# Patient Record
Sex: Male | Born: 1955 | Race: White | Hispanic: No | Marital: Married | Smoking: Former smoker
Health system: Southern US, Community
[De-identification: ages and names within clinical notes are randomized; demographics above are authoritative.]

## PROBLEM LIST (undated history)

## (undated) DIAGNOSIS — E785 Hyperlipidemia, unspecified: Secondary | ICD-10-CM

## (undated) DIAGNOSIS — N486 Induration penis plastica: Secondary | ICD-10-CM

## (undated) DIAGNOSIS — K219 Gastro-esophageal reflux disease without esophagitis: Secondary | ICD-10-CM

## (undated) DIAGNOSIS — I10 Essential (primary) hypertension: Secondary | ICD-10-CM

## (undated) DIAGNOSIS — B019 Varicella without complication: Secondary | ICD-10-CM

## (undated) DIAGNOSIS — C801 Malignant (primary) neoplasm, unspecified: Secondary | ICD-10-CM

## (undated) DIAGNOSIS — N529 Male erectile dysfunction, unspecified: Secondary | ICD-10-CM

## (undated) DIAGNOSIS — K635 Polyp of colon: Secondary | ICD-10-CM

## (undated) DIAGNOSIS — M1990 Unspecified osteoarthritis, unspecified site: Secondary | ICD-10-CM

## (undated) DIAGNOSIS — E669 Obesity, unspecified: Secondary | ICD-10-CM

## (undated) DIAGNOSIS — E119 Type 2 diabetes mellitus without complications: Secondary | ICD-10-CM

## (undated) DIAGNOSIS — F419 Anxiety disorder, unspecified: Secondary | ICD-10-CM

## (undated) HISTORY — DX: Polyp of colon: K63.5

## (undated) HISTORY — PX: COLONOSCOPY: SHX174

## (undated) HISTORY — DX: Male erectile dysfunction, unspecified: N52.9

## (undated) HISTORY — PX: SKIN TAG REMOVAL: SHX780

## (undated) HISTORY — DX: Malignant (primary) neoplasm, unspecified: C80.1

## (undated) HISTORY — DX: Varicella without complication: B01.9

## (undated) HISTORY — PX: VASECTOMY: SHX75

## (undated) HISTORY — DX: Induration penis plastica: N48.6

## (undated) HISTORY — DX: Hyperlipidemia, unspecified: E78.5

## (undated) HISTORY — DX: Essential (primary) hypertension: I10

## (undated) HISTORY — DX: Gastro-esophageal reflux disease without esophagitis: K21.9

## (undated) HISTORY — DX: Type 2 diabetes mellitus without complications: E11.9

## (undated) HISTORY — PX: POLYPECTOMY: SHX149

## (undated) HISTORY — DX: Anxiety disorder, unspecified: F41.9

## (undated) HISTORY — DX: Obesity, unspecified: E66.9

## (undated) HISTORY — DX: Unspecified osteoarthritis, unspecified site: M19.90

---
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
## 2007-07-06 ENCOUNTER — Encounter: Payer: Self-pay | Admitting: Family Medicine

## 2007-07-06 LAB — HM COLONOSCOPY: HM Colonoscopy: ABNORMAL

## 2007-11-12 ENCOUNTER — Encounter: Payer: Self-pay | Admitting: Family Medicine

## 2007-11-12 LAB — CONVERTED CEMR LAB
HDL: 62 mg/dL
LDL Cholesterol: 105 mg/dL

## 2008-05-12 ENCOUNTER — Encounter: Payer: Self-pay | Admitting: Family Medicine

## 2008-05-12 LAB — CONVERTED CEMR LAB: LDL Cholesterol: 94 mg/dL

## 2008-07-25 ENCOUNTER — Ambulatory Visit: Payer: Self-pay | Admitting: Family Medicine

## 2008-07-25 DIAGNOSIS — F528 Other sexual dysfunction not due to a substance or known physiological condition: Secondary | ICD-10-CM | POA: Insufficient documentation

## 2008-07-25 DIAGNOSIS — F1010 Alcohol abuse, uncomplicated: Secondary | ICD-10-CM | POA: Insufficient documentation

## 2008-07-25 DIAGNOSIS — Z87448 Personal history of other diseases of urinary system: Secondary | ICD-10-CM | POA: Insufficient documentation

## 2008-07-28 ENCOUNTER — Encounter: Payer: Self-pay | Admitting: Family Medicine

## 2008-07-28 DIAGNOSIS — E785 Hyperlipidemia, unspecified: Secondary | ICD-10-CM

## 2008-07-28 DIAGNOSIS — E1169 Type 2 diabetes mellitus with other specified complication: Secondary | ICD-10-CM | POA: Insufficient documentation

## 2008-07-28 DIAGNOSIS — M1990 Unspecified osteoarthritis, unspecified site: Secondary | ICD-10-CM | POA: Insufficient documentation

## 2008-11-03 ENCOUNTER — Encounter: Payer: Self-pay | Admitting: Family Medicine

## 2008-11-03 ENCOUNTER — Ambulatory Visit: Payer: Self-pay | Admitting: Family Medicine

## 2008-11-03 DIAGNOSIS — K219 Gastro-esophageal reflux disease without esophagitis: Secondary | ICD-10-CM | POA: Insufficient documentation

## 2008-11-05 LAB — CONVERTED CEMR LAB
ALT: 27 units/L (ref 0–53)
AST: 26 units/L (ref 0–37)
Alkaline Phosphatase: 58 units/L (ref 39–117)
Basophils Absolute: 0.1 10*3/uL (ref 0.0–0.1)
Bilirubin, Direct: 0.1 mg/dL (ref 0.0–0.3)
CO2: 31 meq/L (ref 19–32)
Chloride: 106 meq/L (ref 96–112)
Cholesterol: 182 mg/dL (ref 0–200)
Creatinine, Ser: 1 mg/dL (ref 0.4–1.5)
Eosinophils Absolute: 0.1 10*3/uL (ref 0.0–0.7)
GFR calc non Af Amer: 83 mL/min
HDL: 60 mg/dL (ref >39.0)
LDL Cholesterol: 102 mg/dL — ABNORMAL HIGH (ref 0–99)
Lymphocytes Relative: 30.2 % (ref 12.0–46.0)
MCHC: 34.9 g/dL (ref 30.0–36.0)
MCV: 93.6 fL (ref 78.0–100.0)
Neutrophils Relative %: 57.7 % (ref 43.0–77.0)
PSA: 1.1 ng/mL (ref 0.10–4.00)
Platelets: 238 10*3/uL (ref 150–400)
Potassium: 4.3 meq/L (ref 3.5–5.1)
RBC: 4.66 M/uL (ref 4.22–5.81)
Sodium: 141 meq/L (ref 135–145)
Total Bilirubin: 1.1 mg/dL (ref 0.3–1.2)
VLDL: 20 mg/dL (ref 0–40)

## 2009-09-07 ENCOUNTER — Ambulatory Visit: Payer: Self-pay | Admitting: Family Medicine

## 2009-11-16 ENCOUNTER — Ambulatory Visit: Payer: Self-pay | Admitting: Family Medicine

## 2009-11-16 DIAGNOSIS — R5383 Other fatigue: Secondary | ICD-10-CM | POA: Insufficient documentation

## 2009-11-16 DIAGNOSIS — R5381 Other malaise: Secondary | ICD-10-CM

## 2009-11-16 LAB — CONVERTED CEMR LAB
Albumin: 4.3 g/dL (ref 3.5–5.2)
Basophils Relative: 0.9 % (ref 0.0–3.0)
Bilirubin, Direct: 0.1 mg/dL (ref 0.0–0.3)
CO2: 32 meq/L (ref 19–32)
Calcium: 9 mg/dL (ref 8.4–10.5)
Creatinine, Ser: 1 mg/dL (ref 0.4–1.5)
GFR calc non Af Amer: 82.33 mL/min (ref >60)
HCT: 42.6 % (ref 39.0–52.0)
HDL: 59.1 mg/dL (ref >39.00)
Hemoglobin: 14 g/dL (ref 13.0–17.0)
Lymphocytes Relative: 31.3 % (ref 12.0–46.0)
MCHC: 32.9 g/dL (ref 30.0–36.0)
Monocytes Relative: 9.6 % (ref 3.0–12.0)
Neutro Abs: 3.9 10*3/uL (ref 1.4–7.7)
RBC: 4.43 M/uL (ref 4.22–5.81)
Total CHOL/HDL Ratio: 3
Total Protein: 7.2 g/dL (ref 6.0–8.3)
Triglycerides: 81 mg/dL (ref 0.0–149.0)
VLDL: 16.2 mg/dL (ref 0.0–40.0)

## 2009-11-23 ENCOUNTER — Ambulatory Visit: Payer: Self-pay | Admitting: Family Medicine

## 2009-11-24 ENCOUNTER — Encounter: Payer: Self-pay | Admitting: Family Medicine

## 2010-03-12 ENCOUNTER — Encounter: Payer: Self-pay | Admitting: Family Medicine

## 2010-03-15 ENCOUNTER — Telehealth: Payer: Self-pay | Admitting: Family Medicine

## 2010-06-09 ENCOUNTER — Encounter: Payer: Self-pay | Admitting: Family Medicine

## 2010-06-09 ENCOUNTER — Observation Stay: Payer: Self-pay | Admitting: Internal Medicine

## 2010-06-09 ENCOUNTER — Ambulatory Visit: Payer: Self-pay | Admitting: Cardiology

## 2010-06-17 ENCOUNTER — Telehealth: Payer: Self-pay | Admitting: Family Medicine

## 2010-06-17 ENCOUNTER — Ambulatory Visit: Payer: Self-pay | Admitting: Family Medicine

## 2010-06-17 DIAGNOSIS — F411 Generalized anxiety disorder: Secondary | ICD-10-CM | POA: Insufficient documentation

## 2010-06-17 DIAGNOSIS — R079 Chest pain, unspecified: Secondary | ICD-10-CM | POA: Insufficient documentation

## 2010-06-18 ENCOUNTER — Encounter: Payer: Self-pay | Admitting: Family Medicine

## 2010-06-25 ENCOUNTER — Telehealth: Payer: Self-pay | Admitting: Family Medicine

## 2010-11-09 NOTE — Letter (Signed)
Summary: [facility]   Imported By: Lanelle Bal 06/21/2010 11:08:29  _____________________________________________________________________  External Attachment:    Type:   Image     Comment:   External Document

## 2010-11-09 NOTE — Medication Information (Signed)
Summary: Denial for Lipitor/[employer]  Denial for Lipitor/[employer]   Imported By: Lanelle Bal 03/22/2010 09:11:29  _____________________________________________________________________  External Attachment:    Type:   Image     Comment:   External Document

## 2010-11-09 NOTE — Consult Note (Signed)
Summary: [facility]  [facility]   Imported By: Lanelle Bal 12/04/2009 09:55:40  _____________________________________________________________________  External Attachment:    Type:   Image     Comment:   External Document

## 2010-11-09 NOTE — Progress Notes (Signed)
Summary: prior auth denial for lipitor  Phone Note Other Incoming   Caller: Marine scientist of Call: Letter regarding prior auth denial for lipitor is on your desk.      Lowella Petties CMA  March 15, 2010 8:34 AM   Follow-up for Phone Call        Let patient know that his lipitor denied by insurance  Simvistatin 40 mg, 1 by mouth daily is a generic that is very similar. Call in to the pharmacy of their choice Call in #30, 11 refills. OR if they prefer a 90 day supply, #90 with 3 refills is OK, too Prescription instructions above    Follow-up by: Hannah Beat MD,  March 15, 2010 9:37 AM    New/Updated Medications: SIMVASTATIN 40 MG TABS (SIMVASTATIN) take one tablet daily Prescriptions: SIMVASTATIN 40 MG TABS (SIMVASTATIN) take one tablet daily  #90 x 3   Entered by:   Benny Lennert CMA (AAMA)   Authorized by:   Hannah Beat MD   Signed by:   Benny Lennert CMA (AAMA) on 03/15/2010   Method used:   Electronically to        [facility]* (retail)       [address]       [place], [place]  [address]       Ph: [phone]       Fax: [phone]   RxID:   [id number]

## 2010-11-09 NOTE — Progress Notes (Signed)
Summary: prior Berkley Harvey is needed for dexilant  Phone Note From Pharmacy   Caller: [facility]*/ [employer] Summary of Call: Prior Berkley Harvey is needed for dexilant, form is on your desk. Initial call taken by: Lowella Petties CMA,  June 17, 2010 4:13 PM

## 2010-11-09 NOTE — Progress Notes (Signed)
Summary: prior auth denied for kapidex  Phone Note Other Incoming   Caller: [employer] Summary of Call: Insurance will not cover kapidex, letter is on your desk. Initial call taken by: Lowella Petties CMA,  June 25, 2010 4:05 PM  Follow-up for Phone Call        Call  insurance denied coverage for kapidex.  There are others - reasonable to try them and should be covered by insurance.  protonix 40 mg, 1 by mouth daily 30 minutes before breakfast. #30, 11 refills  Call in #30, 11 refills. OR if they prefer a 90 day supply, #90 with 3 refills is OK, too Prescription instructions above  Follow-up by: Hannah Beat MD,  June 28, 2010 3:22 PM    New/Updated Medications: PROTONIX 40 MG TBEC (PANTOPRAZOLE SODIUM) take on tablet daily Prescriptions: PROTONIX 40 MG TBEC (PANTOPRAZOLE SODIUM) take on tablet daily  #30 x 11   Entered by:   Benny Lennert CMA (AAMA)   Authorized by:   Hannah Beat MD   Signed by:   Benny Lennert CMA (AAMA) on 06/28/2010   Method used:   Electronically to        [facility]* (retail)       [address]       [place], [place]  [address]       Ph: [phone]       Fax: [phone]   RxID:   [id number]   Prior Medications: METOPROLOL TARTRATE 25 MG TABS (METOPROLOL TARTRATE) take 1 by mouth two times a day ELIDEL 1 % CREA (PIMECROLIMUS) Apply to effected area two times a day SIMVASTATIN 40 MG TABS (SIMVASTATIN) take one tablet daily ASPIRIN 81 MG TABS (ASPIRIN) take 1 by mouth once daily VITAMIN C 500 MG TABS (ASCORBIC ACID) take 1 by mouth once daily VITAMIN B-12 1000 MCG TABS (CYANOCOBALAMIN) take 1 by mouth once daily MULTIVITAMINS  CAPS (MULTIPLE VITAMIN) take 1 by mouth once daily GLUCOSAMINE 500 MG CAPS (GLUCOSAMINE SULFATE) take 1 by mouth once daily DEXILANT 60 MG CPDR (DEXLANSOPRAZOLE) 1 by mouth daily LORAZEPAM 0.5 MG TABS (LORAZEPAM) 1 by mouth two times a day PROTONIX 40 MG TBEC (PANTOPRAZOLE SODIUM) take on tablet  daily Current Allergies: No known allergies

## 2010-11-09 NOTE — Assessment & Plan Note (Signed)
Summary: F/U [facility]  D/C 06/09/10/[facility]   Vital Signs:  Patient profile:   55 year old Garza Weight:      250 pounds Temp:     99.1 degrees F oral Pulse rate:   68 / minute Pulse rhythm:   regular BP sitting:   120 / 70  (left arm) Cuff size:   large  Vitals Entered By: Mervin Hack CMA Duncan Dull) (June 17, 2010 9:06 AM) CC: hospital follow-up   History of Present Illness: Tony Garza:  d/c [facility], 06/09/2010.  Was at home, had some severe pressure in his chest.  Feels some pressure in the left anterior chest wall. Across chest and up his neck. Anxious uneasy, could not catch his breath. Went to the hospital. Did not really feel better since then.   Having a lot of stress with work. Financial issues. House in [facility]. Not sleeeping well.  Feeling older.   all hospital documentation reviewed. The patient was admitted for chest pain, had a negative chest x-ray, normal cardiac enzymes, and ultimately had negative exercise nuclear test.  He ran for about 10 minutes or walk for about 10 minutes on the Bruce protocol, and then had a normal  nuclear scan.    Allergies: No Known Drug Allergies  Past History:  Past Medical History: Peyroni's diease Hyperlipidemia Duypetren's contractures Osteoarthritis Erectile Dysfunction GERD Obesity Anxiety  Past Surgical History: Vasectomy [facility], 05/2010, CP, negative nuclear stress, 60% EF  Colonoscopy 2008, polyps, repeat 2013  Review of Systems      See HPI General:  Complains of fatigue and sleep disorder; denies chills and fever. CV:  Complains of chest pain or discomfort and fainting; denies palpitations, shortness of breath with exertion, swelling of feet, and swelling of hands. Psych:  Complains of anxiety and irritability.  Physical Exam  General:  Well-developed,well-nourished,in no acute distress; alert,appropriate and cooperative throughout examination Head:  normocephalic and atraumatic.   Ears:  no external  deformities.   Nose:  no external deformity.   Lungs:  Normal respiratory effort, chest expands symmetrically. Lungs are clear to auscultation, no crackles or wheezes. Heart:  Normal rate and regular rhythm. S1 and S2 normal without gallop, murmur, click, rub or other extra sounds. Extremities:  No clubbing, cyanosis, edema, or deformity noted with normal full range of motion of all joints.   Neurologic:  alert & oriented X3 and gait normal.   Cervical Nodes:  No lymphadenopathy noted Psych:  Cognition and judgment appear intact. Alert and cooperative with normal attention span and concentration. No apparent delusions, illusions, hallucinations   Impression & Recommendations:  Problem # 1:  CHEST PAIN (ICD-786.50) normal nuclear stress test is reassuring. I think this is mostly decompensated GERD, but also there is a very significant anxiety component present.   Both diagnostically and therapeutically, I have given him some Ativan to try when he is having some chest pain.  Also, will change to [facility] for extended GERD protection.  Problem # 2:  GERD (ICD-530.81) Assessment: Deteriorated  The following medications were removed from the medication list:    Omeprazole 20 Mg Cpdr (Omeprazole) .Marland Kitchen... 1 by mouth daily His updated medication list for this problem includes:    Dexilant 60 Mg Cpdr (Dexlansoprazole) .Marland Kitchen... 1 by mouth daily  Problem # 3:  OTHER ANXIETY STATES (ICD-300.09) Assessment: New  His updated medication list for this problem includes:    Lorazepam 0.5 Mg Tabs (Lorazepam) .Marland Kitchen... 1 by mouth two times a day  Complete Medication List: 1)  Metoprolol Tartrate 25 Mg Tabs (Metoprolol tartrate) .... Take 1 by mouth two times a day 2)  Elidel 1 % Crea (Pimecrolimus) .... Apply to effected area two times a day 3)  Simvastatin 40 Mg Tabs (Simvastatin) .... Take one tablet daily 4)  Aspirin 81 Mg Tabs (Aspirin) .... Take 1 by mouth once daily 5)  Vitamin C 500 Mg Tabs  (Ascorbic acid) .... Take 1 by mouth once daily 6)  Vitamin B-12 1000 Mcg Tabs (Cyanocobalamin) .... Take 1 by mouth once daily 7)  Multivitamins Caps (Multiple vitamin) .... Take 1 by mouth once daily 8)  Glucosamine 500 Mg Caps (Glucosamine sulfate) .... Take 1 by mouth once daily 9)  Dexilant 60 Mg Cpdr (Dexlansoprazole) .Marland Kitchen.. 1 by mouth daily 10)  Lorazepam 0.5 Mg Tabs (Lorazepam) .Marland Kitchen.. 1 by mouth two times a day  Patient Instructions: 1)  Try Dexilant samples, 30 minutes before breakfast 2)  if getting some anxiety, ok to try 1 dose of lorazepam Prescriptions: LORAZEPAM 0.5 MG TABS (LORAZEPAM) 1 by mouth two times a day  #30 x 0   Entered and Authorized by:   Hannah Beat MD   Signed by:   Hannah Beat MD on 06/17/2010   Method used:   Print then Give to Patient   RxID:   [id number] DEXILANT 60 MG CPDR (DEXLANSOPRAZOLE) 1 by mouth daily  #30 x 11   Entered and Authorized by:   Hannah Beat MD   Signed by:   Hannah Beat MD on 06/17/2010   Method used:   Electronically to        [facility]* (retail)       [address]       [place], [place]  [address]       Ph: [phone]       Fax: [phone]   RxID:   [id number]   Current Allergies (reviewed today): No known allergies

## 2010-11-09 NOTE — Assessment & Plan Note (Signed)
Summary: CPX/[facility]   Vital Signs:  Patient profile:   55 year old male Height:      73 inches Weight:      263.6 pounds BMI:     34.90 Temp:     98.7 degrees F oral Pulse rate:   68 / minute Pulse rhythm:   regular BP sitting:   120 / 86  (left arm) Cuff size:   large  Vitals Entered By: Benny Lennert CMA Duncan Dull) (November 23, 2009 2:51 PM)   History of Present Illness: Chief complaint cpx  Preventive Screening-Counseling & Management  Alcohol-Tobacco     Alcohol drinks/day: 4     Alcohol type: beer     Alcohol Counseling: to decrease amount and/or frequency of alcohol intake     Feels need to cut down: yes     Smoking Status: quit     Pack years: 20     Tobacco Counseling: to remain off tobacco products  Caffeine-Diet-Exercise     Diet Comments: poor     Diet Counseling: to improve diet; diet is suboptimal     Does Patient Exercise: no     Type of exercise: walks a few times a week     Exercise Counseling: to improve exercise regimen  Hep-HIV-STD-Contraception     STD Risk: no risk noted     Testicular SE Education/Counseling to perform regular STE      Sexual History:  currently monogamous.        Drug Use:  never.    Clinical Review Panels:  Prevention   Last Colonoscopy:  abnormal (07/06/2007)   Last PSA:  1.37 (11/16/2009)  Immunizations   Last Flu Vaccine:  Fluvax 3+ (09/07/2009)   Last H1N1 Vaccine 1:  H1N1 vaccine G code (11/03/2008)  Lipid Management   Cholesterol:  175 (11/16/2009)   LDL (bad choesterol):  100 (11/16/2009)   HDL (good cholesterol):  59.10 (11/16/2009)  CBC   WBC:  7.3 (11/16/2009)   RBC:  4.43 (11/16/2009)   Hgb:  14.0 (11/16/2009)   Hct:  42.6 (11/16/2009)   Platelets:  255.0 (11/16/2009)   MCV  96.1 (11/16/2009)   MCHC  32.9 (11/16/2009)   RDW  12.4 (11/16/2009)   PMN:  54.5 (11/16/2009)   Lymphs:  31.3 (11/16/2009)   Monos:  9.6 (11/16/2009)   Eosinophils:  3.7 (11/16/2009)   Basophil:  0.9  (11/16/2009)  Complete Metabolic Panel   Glucose:  119 (11/16/2009)   Sodium:  142 (11/16/2009)   Potassium:  4.7 (11/16/2009)   Chloride:  104 (11/16/2009)   CO2:  32 (11/16/2009)   BUN:  8 (11/16/2009)   Creatinine:  1.0 (11/16/2009)   Albumin:  4.3 (11/16/2009)   Total Protein:  7.2 (11/16/2009)   Calcium:  9.0 (11/16/2009)   Total Bili:  0.5 (11/16/2009)   Alk Phos:  65 (11/16/2009)   SGPT (ALT):  21 (11/16/2009)   SGOT (AST):  21 (11/16/2009)   Current Problems (verified): 1)  Other Malaise and Fatigue  (ICD-780.79) 2)  Screening For Diabetes Mellitus  (ICD-V77.1) 3)  Obesity  (ICD-278.00) 4)  Gerd  (ICD-530.81) 5)  Health Maintenance Exam  (ICD-V70.0) 6)  Special Screening Malignant Neoplasm of Prostate  (ICD-V76.44) 7)  Encounter For Long-term Use of Other Medications  (ICD-V58.69) 8)  Alcohol Use  (ICD-305.00) 9)  Impotence  (ICD-302.72) 10)  Peyronie's Disease, Hx of  (ICD-V13.09) 11)  Osteoarthritis  (ICD-715.90) 12)  Hyperlipidemia  (ICD-272.4)  Allergies (verified): No Known Drug Allergies  Past History:  Past medical, surgical, family and social histories (including risk factors) reviewed, and no changes noted (except as noted below).  Past Medical History: Reviewed history from 11/03/2008 and no changes required. Peyroni's diease Hyperlipidemia Duypetren's contractures Osteoarthritis Erectile Dysfunction GERD Obesity  Past Surgical History: Reviewed history from 07/25/2008 and no changes required. Vasectomy  Colonoscopy 2008, polyps, repeat 2013  Family History: Reviewed history from 09/07/2009 and no changes required. Father: Sudden death < 44 Mother: DM Siblings:  Family History of Sudden Death Family History Diabetes 1st degree relative  Father, d/c from lymphoma, found in ENT  Social History: Reviewed history from 07/25/2008 and no changes required. Occupation: Financial planner Tobacco, 20 year pack history, currently quit Alcohol  use-yes, case beer/week Regular exercise-no STD Risk:  no risk noted Sexual History:  currently monogamous Drug Use:  never  Review of Systems  General: Denies fever, chills, sweats, anorexia, fatigue, weakness, malaise Eyes: Denies blurring, vision loss ENT: Denies earache, nasal congestion, nosebleeds, sore throat, and hoarseness.  Cardiovascular: Denies chest pains, palpitations, syncope, dyspnea on exertion,  Respiratory: Denies cough, dyspnea at rest, excessive sputum,wheeezing GI: Denies nausea, vomiting, diarrhea, constipation, change in bowel habits, abdominal pain, melena, hematochezia, HAD A SMALL BOIL PERIRECTAL GU: CONT WITH ED ISSUES, BUT PRIMARY PEYRONIES Musculoskeletal: SOME PF OCC Derm: SKIN LESION ADDRESSED ON PRIOR OFFICE VISIT STILL PRESENT AND HAS DERMATOLOGY APPOINTMENT THIS WEEK Neuro: Denies  paresthesias, frequent falls, frequent headaches, and difficulty walking.  Psych: Denies depression, anxiety Endocrine: Denies cold intolerance, heat intolerance, polydipsia, polyphagia, polyuria, and unusual weight change.  Heme: Denies enlarged lymph nodes Allergy: No hayfever   Otherwise, the pertinent positives and negatives are listed above and in the HPI, otherwise a full review of systems has been reviewed and is negative unless noted positive.   Physical Exam  General:  Well-developed,well-nourished,in no acute distress; alert,appropriate and cooperative throughout examination Head:  Normocephalic and atraumatic without obvious abnormalities.  Eyes:  vision grossly intact, pupils equal, pupils round, pupils reactive to light, and pupils react to accomodation.   Ears:  External ear exam shows no significant lesions or deformities.  Otoscopic examination reveals clear canals, tympanic membranes are intact bilaterally without bulging, retraction, inflammation or discharge. Hearing is grossly normal bilaterally. Nose:  External nasal examination shows no deformity or  inflammation. Nasal mucosa are pink and moist without lesions or exudates. Mouth:  Oral mucosa and oropharynx without lesions or exudates.  Teeth in good repair. Neck:  No deformities, masses, or tenderness noted. Chest Wall:  No deformities, masses, tenderness or gynecomastia noted. Lungs:  Normal respiratory effort, chest expands symmetrically. Lungs are clear to auscultation, no crackles or wheezes. Heart:  Normal rate and regular rhythm. S1 and S2 normal without gallop, murmur, click, rub or other extra sounds. Abdomen:  Bowel sounds positive,abdomen soft and non-tender without masses, organomegaly or hernias noted. Rectal:  No external abnormalities noted. Normal sphincter tone. No rectal masses or tenderness. Genitalia:  Testes bilaterally descended without nodularity, tenderness or masses. No scrotal masses or lesions. No penis lesions or urethral discharge. Prostate:  mod enlargement, no nodules, no asymmetry, and no induration.   Msk:  normal ROM, no joint instability, and no crepitation.   Extremities:  No clubbing, cyanosis, edema, or deformity noted with normal full range of motion of all joints.   Neurologic:  alert & oriented X3, sensation intact to light touch, and gait normal.   Skin:  L leg, 2 x 3 cm lesion, some crust and elevated  Cervical Nodes:  No lymphadenopathy noted Psych:  Cognition and judgment appear intact. Alert and cooperative with normal attention span and concentration. No apparent delusions, illusions, hallucinations   Impression & Recommendations:  Problem # 1:  HEALTH MAINTENANCE EXAM (ICD-V70.0) The patient's preventative maintenance and recommended screening tests for an annual wellness exam were reviewed in full today. Brought up to date unless services declined.  Counselled on the importance of diet, exercise, and its role in overall health and mortality. The patient's FH and SH was reviewed, including their home life, tobacco status, and drug and  alcohol status.   DECREASE ETOH KEEP DERM APPT THIS WEEK  Complete Medication List: 1)  Lipitor 20 Mg Tabs (Atorvastatin calcium) .... Take one tablet daily 2)  Vitamin E  .... Take one tablet daily 3)  Glucosamine  .... Take one tablet daily 4)  Elidel 1 % Crea (Pimecrolimus) .... Apply to effected area two times a day 5)  Omeprazole 20 Mg Cpdr (Omeprazole) .Marland Kitchen.. 1 by mouth daily Prescriptions: OMEPRAZOLE 20 MG CPDR (OMEPRAZOLE) 1 by mouth daily  #30 x 11   Entered and Authorized by:   Hannah Beat MD   Signed by:   Hannah Beat MD on 11/23/2009   Method used:   Electronically to        [facility]* (retail)       [address]       [place], [place]  [address]       Ph: [phone]       Fax: [phone]   RxID:   [id number] ELIDEL 1 % CREA (PIMECROLIMUS) Apply to effected area two times a day  #1 x 11   Entered by:   Benny Lennert CMA (AAMA)   Authorized by:   Hannah Beat MD   Signed by:   Hannah Beat MD on 11/23/2009   Method used:   Electronically to        [facility]* (retail)       [address]       [place], [place]  [address]       Ph: [phone]       Fax: [phone]   RxID:   [id number] LIPITOR 20 MG TABS (ATORVASTATIN CALCIUM) take one tablet daily  #30 x 11   Entered by:   Benny Lennert CMA (AAMA)   Authorized by:   Hannah Beat MD   Signed by:   Hannah Beat MD on 11/23/2009   Method used:   Electronically to        [facility]* (retail)       [address]       [place], [place]  [address]       Ph: [phone]       Fax: [phone]   RxID:   [id number]   Current Allergies (reviewed today): No known allergies

## 2010-11-09 NOTE — Medication Information (Signed)
Summary: [employer]'s review  [employer]'s review   Imported By: Lester [place] 07/08/2010 09:40:39  _____________________________________________________________________  External Attachment:    Type:   Image     Comment:   External Document

## 2010-12-11 ENCOUNTER — Encounter: Payer: Self-pay | Admitting: Family Medicine

## 2010-12-11 ENCOUNTER — Ambulatory Visit (INDEPENDENT_AMBULATORY_CARE_PROVIDER_SITE_OTHER): Payer: Self-pay | Admitting: Family Medicine

## 2010-12-11 DIAGNOSIS — J019 Acute sinusitis, unspecified: Secondary | ICD-10-CM

## 2010-12-16 NOTE — Assessment & Plan Note (Signed)
Summary: Sinusitis   Vital Signs:  Patient profile:   55 year old male Height:      73 inches Weight:      260 pounds BMI:     34.43 O2 Sat:      97 % on Room air Temp:     98.6 degrees F oral Pulse rate:   76 / minute BP sitting:   122 / 80  (left arm) Cuff size:   large  Vitals Entered By: Payton Spark CMA (December 11, 2010 9:38 AM)  O2 Flow:  Room air CC: head and chest congestion, cough x 1 + weeks   Primary Care Provider:  Hannah Beat MD  CC:  head and chest congestion and cough x 1 + weeks.  History of Present Illness: head and chest congestion, cough x 1 + weeks.  Sputum is mostly green and brown.  No longer smokes.  No fever.  No  GI sxs.  Mild ST.  Some ear pain and pressure. No OTC meds.  cough is keeping him up at night. No other sxs.   Current Medications (verified): 1)  Metoprolol Tartrate 25 Mg Tabs (Metoprolol Tartrate) .... Take 1 By Mouth Two Times A Day 2)  Elidel 1 % Crea (Pimecrolimus) .... Apply To Effected Area Two Times A Day 3)  Simvastatin 40 Mg Tabs (Simvastatin) .... Take One Tablet Daily 4)  Aspirin 81 Mg Tabs (Aspirin) .... Take 1 By Mouth Once Daily 5)  Vitamin C 500 Mg Tabs (Ascorbic Acid) .... Take 1 By Mouth Once Daily 6)  Vitamin B-12 1000 Mcg Tabs (Cyanocobalamin) .... Take 1 By Mouth Once Daily 7)  Multivitamins  Caps (Multiple Vitamin) .... Take 1 By Mouth Once Daily 8)  Glucosamine 500 Mg Caps (Glucosamine Sulfate) .... Take 1 By Mouth Once Daily 9)  Dexilant 60 Mg Cpdr (Dexlansoprazole) .Marland Kitchen.. 1 By Mouth Daily 10)  Lorazepam 0.5 Mg Tabs (Lorazepam) .Marland Kitchen.. 1 By Mouth Two Times A Day 11)  Protonix 40 Mg Tbec (Pantoprazole Sodium) .... Take On Tablet Daily  Allergies (verified): No Known Drug Allergies  Physical Exam  General:  Well-developed,well-nourished,in no acute distress; alert,appropriate and cooperative throughout examination Head:  Normocephalic and atraumatic without obvious abnormalities. No apparent alopecia or balding.  no sinus tenderness.  Eyes:  No corneal or conjunctival inflammation noted. EOMI. Perrla.  Ears:  External ear exam shows no significant lesions or deformities.  Otoscopic examination reveals clear canals, tympanic membranes are intact bilaterally without bulging, retraction, inflammation or discharge. Hearing is grossly normal bilaterally. Nose:  External nasal examination shows no deformity or inflammation. Nasal mucosa are pink and moist without lesions or exudates. Mouth:  Oral mucosa and oropharynx without lesions or exudates.  Teeth in good repair. Neck:  No deformities, masses, or tenderness noted. Lungs:  Normal respiratory effort, chest expands symmetrically. Lungs are clear to auscultation, no crackles or wheezes. Heart:  Normal rate and regular rhythm. S1 and S2 normal without gallop, murmur, click, rub or other extra sounds. Abdomen:  Bowel sounds positive,abdomen soft and non-tender without masses, organomegaly or hernias noted. Pulses:  RAdila 2+  Skin:  no rashes.   Cervical Nodes:  No lymphadenopathy noted Psych:  Cognition and judgment appear intact. Alert and cooperative with normal attention span and concentration. No apparent delusions, illusions, hallucinations   Impression & Recommendations:  Problem # 1:  SINUSITIS - ACUTE-NOS (ICD-461.9)  His updated medication list for this problem includes:    Zithromax Z-pak 250 Mg Tabs (Azithromycin) .Marland KitchenMarland KitchenMarland KitchenMarland Kitchen  Take as directed.  Instructed on treatment. Call if symptoms persist or worsen. He declined me for cough.   Complete Medication List: 1)  Metoprolol Tartrate 25 Mg Tabs (Metoprolol tartrate) .... Take 1 by mouth two times a day 2)  Elidel 1 % Crea (Pimecrolimus) .... Apply to effected area two times a day 3)  Simvastatin 40 Mg Tabs (Simvastatin) .... Take one tablet daily 4)  Aspirin 81 Mg Tabs (Aspirin) .... Take 1 by mouth once daily 5)  Vitamin C 500 Mg Tabs (Ascorbic acid) .... Take 1 by mouth once daily 6)  Vitamin  B-12 1000 Mcg Tabs (Cyanocobalamin) .... Take 1 by mouth once daily 7)  Multivitamins Caps (Multiple vitamin) .... Take 1 by mouth once daily 8)  Glucosamine 500 Mg Caps (Glucosamine sulfate) .... Take 1 by mouth once daily 9)  Dexilant 60 Mg Cpdr (Dexlansoprazole) .Marland Kitchen.. 1 by mouth daily 10)  Lorazepam 0.5 Mg Tabs (Lorazepam) .Marland Kitchen.. 1 by mouth two times a day 11)  Zithromax Z-pak 250 Mg Tabs (Azithromycin) .... Take as directed.  Patient Instructions: 1)  can use nasal saline two times a day  2)  complete the antibiotic and if not better in one week then please call.  Prescriptions: ZITHROMAX Z-PAK 250 MG TABS (AZITHROMYCIN) Take as directed.  #1 pack x 0   Entered and Authorized by:   Nani Gasser MD   Signed by:   Nani Gasser MD on 12/11/2010   Method used:   Electronically to        [facility]* (retail)       [address]       [place], [place]  [address]       Ph: [phone]       Fax: [phone]   RxID:   [id number]    Orders Added: 1)  Est. Patient Level III [40102]

## 2011-03-15 ENCOUNTER — Other Ambulatory Visit: Payer: Self-pay | Admitting: *Deleted

## 2011-03-15 MED ORDER — SIMVASTATIN 40 MG PO TABS: 40.0000 mg | ORAL_TABLET | Freq: Every day | ORAL | Status: DC

## 2011-04-18 ENCOUNTER — Other Ambulatory Visit: Payer: Self-pay | Admitting: *Deleted

## 2011-04-18 MED ORDER — SIMVASTATIN 40 MG PO TABS: 40.0000 mg | ORAL_TABLET | Freq: Every day | ORAL | Status: DC

## 2011-04-18 NOTE — Telephone Encounter (Signed)
Error

## 2011-05-03 ENCOUNTER — Other Ambulatory Visit: Payer: Self-pay | Admitting: *Deleted

## 2011-05-10 ENCOUNTER — Other Ambulatory Visit: Payer: Self-pay | Admitting: *Deleted

## 2011-05-10 MED ORDER — METOPROLOL TARTRATE 25 MG PO TABS: 25.0000 mg | ORAL_TABLET | Freq: Two times a day (BID) | ORAL | Status: DC

## 2011-06-21 ENCOUNTER — Other Ambulatory Visit: Payer: Self-pay | Admitting: *Deleted

## 2011-06-21 MED ORDER — DEXLANSOPRAZOLE 60 MG PO CPDR: 60.0000 mg | DELAYED_RELEASE_CAPSULE | Freq: Every day | ORAL | Status: DC

## 2011-06-21 MED ORDER — SIMVASTATIN 40 MG PO TABS: 40.0000 mg | ORAL_TABLET | Freq: Every day | ORAL | Status: DC

## 2011-06-24 ENCOUNTER — Other Ambulatory Visit (INDEPENDENT_AMBULATORY_CARE_PROVIDER_SITE_OTHER): Payer: Commercial Indemnity

## 2011-06-24 ENCOUNTER — Encounter: Payer: Self-pay | Admitting: Family Medicine

## 2011-06-24 DIAGNOSIS — Z79899 Other long term (current) drug therapy: Secondary | ICD-10-CM

## 2011-06-24 DIAGNOSIS — Z13220 Encounter for screening for lipoid disorders: Secondary | ICD-10-CM

## 2011-06-24 DIAGNOSIS — Z125 Encounter for screening for malignant neoplasm of prostate: Secondary | ICD-10-CM

## 2011-06-24 LAB — CBC WITH DIFFERENTIAL/PLATELET
Basophils Relative: 0.7 % (ref 0.0–3.0)
Eosinophils Absolute: 0.2 10*3/uL (ref 0.0–0.7)
Eosinophils Relative: 3.4 % (ref 0.0–5.0)
HCT: 41.7 % (ref 39.0–52.0)
Lymphs Abs: 2.1 10*3/uL (ref 0.7–4.0)
MCHC: 32.9 g/dL (ref 30.0–36.0)
MCV: 95.2 fl (ref 78.0–100.0)
Monocytes Absolute: 0.5 10*3/uL (ref 0.1–1.0)
Platelets: 233 10*3/uL (ref 150.0–400.0)
RBC: 4.38 Mil/uL (ref 4.22–5.81)
WBC: 6.7 10*3/uL (ref 4.5–10.5)

## 2011-06-24 LAB — HEPATIC FUNCTION PANEL: Albumin: 4.1 g/dL (ref 3.5–5.2)

## 2011-06-24 LAB — LIPID PANEL
Cholesterol: 177 mg/dL (ref 0–200)
HDL: 49.9 mg/dL (ref >39.00)

## 2011-06-24 LAB — BASIC METABOLIC PANEL
BUN: 10 mg/dL (ref 6–23)
Calcium: 9.2 mg/dL (ref 8.4–10.5)
GFR: 83.79 mL/min (ref >60.00)
Glucose, Bld: 146 mg/dL — ABNORMAL HIGH (ref 70–99)

## 2011-06-24 LAB — PSA: PSA: 1.17 ng/mL (ref 0.10–4.00)

## 2011-06-29 ENCOUNTER — Encounter: Payer: Self-pay | Admitting: Family Medicine

## 2011-06-29 ENCOUNTER — Ambulatory Visit (INDEPENDENT_AMBULATORY_CARE_PROVIDER_SITE_OTHER): Payer: Commercial Indemnity | Admitting: Family Medicine

## 2011-06-29 VITALS — BP 140/82 | HR 60 | Temp 98.8°F | Ht 72.0 in | Wt 264.4 lb

## 2011-06-29 DIAGNOSIS — Z0000 Encounter for general adult medical examination without abnormal findings: Secondary | ICD-10-CM

## 2011-06-29 DIAGNOSIS — E119 Type 2 diabetes mellitus without complications: Secondary | ICD-10-CM

## 2011-06-29 DIAGNOSIS — I10 Essential (primary) hypertension: Secondary | ICD-10-CM

## 2011-06-29 DIAGNOSIS — E785 Hyperlipidemia, unspecified: Secondary | ICD-10-CM

## 2011-06-29 DIAGNOSIS — Z23 Encounter for immunization: Secondary | ICD-10-CM

## 2011-06-29 MED ORDER — METFORMIN HCL ER 500 MG PO TB24: 500.0000 mg | ORAL_TABLET | Freq: Every day | ORAL | Status: DC

## 2011-06-29 NOTE — Progress Notes (Signed)
Subjective:    Patient ID: Tony Garza, male    DOB: [birth year], 55 y.o.   MRN: [id number]  HPI  Tony Garza, a 55 y.o. male presents today in the office for the following:    Here for CPX, also new dx of DM, f/u other chronic problems.  Preventative Health Maintenance Visit:  Health Maintenance Summary Reviewed and updated, unless pt declines services.  Tobacco History Reviewed. Alcohol: occ use Exercise Habits: minimal STD concerns: no risk or activity to increase risk Drug Use: None Encouraged self-testicular check  Health Maintenance  Topic Date Due  . Tetanus/tdap  06/18/1973  . Influenza Vaccine  07/10/2012  . Colonoscopy  07/05/2017   Labs reviewed with the patient.   Lipids:    Component Value Date/Time   CHOL 177 06/24/2011 1014   TRIG 51.0 06/24/2011 1014   HDL 49.90 06/24/2011 1014   VLDL 10.2 06/24/2011 1014   CHOLHDL 4 06/24/2011 1014    CBC:    Component Value Date/Time   WBC 6.7 06/24/2011 1014   HGB 13.7 06/24/2011 1014   HCT 41.7 06/24/2011 1014   PLT 233.0 06/24/2011 1014   MCV 95.2 06/24/2011 1014   NEUTROABS 3.8 06/24/2011 1014   LYMPHSABS 2.1 06/24/2011 1014   MONOABS 0.5 06/24/2011 1014   EOSABS 0.2 06/24/2011 1014   BASOSABS 0.0 06/24/2011 1014    Basic Metabolic Panel:    Component Value Date/Time   NA 139 06/24/2011 1014   K 4.8 06/24/2011 1014   CL 102 06/24/2011 1014   CO2 30 06/24/2011 1014   BUN 10 06/24/2011 1014   CREATININE 1.0 06/24/2011 1014   GLUCOSE 146* 06/24/2011 1014   CALCIUM 9.2 06/24/2011 1014    Lab Results  Component Value Date   ALT 25 06/24/2011   AST 21 06/24/2011   ALKPHOS 64 06/24/2011   BILITOT 0.5 06/24/2011    Lab Results  Component Value Date   PSA 1.17 06/24/2011   PSA 1.37 11/16/2009   PSA 1.10 11/03/2008   1. New-onset type 2 diabetes mellitus. The patient has been prediabetic for the last 2 years, and now his most recent fasting blood sugar was 146. He is asymptomatic and denies any nausea, blurred  vision, increased urination, or generally feeling bad at all. He does have a younger brother by about 12 years who was also recently diagnosed with diabetes mellitus. Other risk factors include significant obesity at 264 pounds.  2. Hypertension. Generally borderline. Goals have changed as of today by diagnosis of diabetes mellitus. Compliant with medications. Asymptomatic and no problems with blood pressure medicine.  3. Hyperlipidemia: Tolerating Zocor without difficulty. Cholesterol generally doing well without any difficulty.  Patient Active Problem List  Diagnoses  . HYPERLIPIDEMIA  . OBESITY  . OTHER ANXIETY STATES  . IMPOTENCE  . ALCOHOL USE  . GERD  . OSTEOARTHRITIS  . OTHER MALAISE AND FATIGUE  . PEYRONIE'S DISEASE, HX OF  . Diabetes mellitus, type 2  . Unspecified essential hypertension   Past Medical History  Diagnosis Date  . Peyronie disease   . Hyperlipidemia   . Arthritis     osteoarthritis  . GERD (gastroesophageal reflux disease)   . ED (erectile dysfunction)   . Obesity   . Anxiety   . Diabetes mellitus, type 2 06/30/2011  . Unspecified essential hypertension 06/30/2011   Past Surgical History  Procedure Date  . Vasectomy    History  Substance Use Topics  . Smoking status: Former Smoker -- 1.0 packs/day  for 20 years  . Smokeless tobacco: Not on file  . Alcohol Use: Yes   Family History  Problem Relation Age of Onset  . Diabetes Mother   . Cancer Father     lymphoma   No Known Allergies Current Outpatient Prescriptions on File Prior to Visit  Medication Sig Dispense Refill  . aspirin 81 MG tablet Take 81 mg by mouth daily.        Marland Kitchen dexlansoprazole (DEXILANT) 60 MG capsule Take 1 capsule (60 mg total) by mouth daily.  30 capsule  0  . glucosamine-chondroitin 500-400 MG tablet Take 1 tablet by mouth daily.        Marland Kitchen LORazepam (ATIVAN) 0.5 MG tablet Take 0.5 mg by mouth 2 (two) times daily.        . metoprolol tartrate (LOPRESSOR) 25 MG tablet Take 1  tablet (25 mg total) by mouth 2 (two) times daily.  60 tablet  1  . Multiple Vitamin (MULTIVITAMIN) tablet Take 1 tablet by mouth daily.        . pimecrolimus (ELIDEL) 1 % cream Apply 1 application topically 2 (two) times daily.        . simvastatin (ZOCOR) 40 MG tablet Take 1 tablet (40 mg total) by mouth at bedtime.  30 tablet  0  . vitamin B-12 (CYANOCOBALAMIN) 1000 MCG tablet Take 1,000 mcg by mouth daily.        . vitamin C (ASCORBIC ACID) 500 MG tablet Take 500 mg by mouth daily.            Review of Systems General: Denies fever, chills, sweats. No significant weight loss. Eyes: Denies blurring,significant itching ENT: Denies earache, sore throat, and hoarseness. Cardiovascular: Denies chest pains, palpitations, dyspnea on exertion Respiratory: Denies cough, dyspnea at rest,wheeezing Breast: no concerns about lumps GI: Denies nausea, vomiting, diarrhea, constipation, change in bowel habits, abdominal pain, melena, hematochezia GU: Denies penile discharge, occ ED, no significant urinary flow / outflow problems. No STD concerns. Musculoskeletal: Denies back pain, joint pain Derm: Denies rash, itching Neuro: Denies  paresthesias, frequent falls, frequent headaches Psych: Denies depression, anxiety Endocrine: Denies cold intolerance, heat intolerance, polydipsia Heme: Denies enlarged lymph nodes Allergy: No hayfever     Objective:   Physical Exam   Physical Exam  Blood pressure 140/82, pulse 60, temperature 98.8 F (37.1 C), temperature source Oral, height 6' (1.829 m), weight 264 lb 6.4 oz (119.931 kg), SpO2 98.00%.  PE: GEN: well developed, well nourished, no acute distress Eyes: conjunctiva and lids normal, PERRLA, EOMI ENT: TM clear, nares clear, oral exam WNL Neck: supple, no lymphadenopathy, no thyromegaly, no JVD Pulm: clear to auscultation and percussion, respiratory effort normal CV: regular rate and rhythm, S1-S2, no murmur, rub or gallop, no bruits, peripheral  pulses normal and symmetric, no cyanosis, clubbing, edema or varicosities Chest: no scars, masses, no gynecomastia   GI: soft, non-tender; no hepatosplenomegaly, masses; active bowel sounds all quadrants GU: no hernia, testicular mass, penile discharge, priapism or prostate enlargement Lymph: no cervical, axillary or inguinal adenopathy MSK: gait normal, muscle tone and strength WNL, no joint swelling, effusions, discoloration, crepitus  SKIN: clear, good turgor, color WNL, no rashes, lesions, or ulcerations Neuro: normal mental status, normal strength, sensation, and motion Psych: alert; oriented to person, place and time, normally interactive and not anxious or depressed in appearance.       Assessment & Plan:   1. Routine general medical examination at a health care facility : The patient's preventative maintenance  and recommended screening tests for an annual wellness exam were reviewed in full today. Brought up to date unless services declined.  Counselled on the importance of diet, exercise, and its role in overall health and mortality. The patient's FH and SH was reviewed, including their home life, tobacco status, and drug and alcohol status.   Work on diet, exercise, losing weight   2. Flu vaccine need  Flu vaccine greater than or equal to 3yo preservative free IM  3. Diabetes mellitus, type 2 : new diagnosis of diabetes mellitus type 2. Reviewed pathogenesis and pathophysiology of diabetes mellitus type 2. Reviewed basic diet changes, gave information including American diabetes Association, reviewed basic changes in goals with the patient. We'll start him on Metformin XR 500 mg and f/u in 1 month to go over in greater detail, update all vaccines, answer his questions. metFORMIN (GLUCOPHAGE XR) 500 MG 24 hr tablet  4. HYPERLIPIDEMIA : doing well, cont Zocor.   5. Unspecified essential hypertension : elevated today. If continues to have elevation at his recheck, would add an ACE  inhibitor given his new diagnosis of DM and goal of 130/80.

## 2011-06-29 NOTE — Patient Instructions (Addendum)
Recheck in 1 month    Diabetes, Type 2 Diabetes is a lasting (chronic) disease. In type 2 diabetes, the pancreas does not make enough insulin (a hormone), and the body does not respond normally to the insulin that is made. This type of diabetes was also previously called adult onset diabetes. About 90% of all those who have diabetes have type 2. It usually occurs after the age of 38 but can occur at any age. CAUSES Unlike type 1 diabetes, which happens because insulin is no longer being made, type 2 diabetes happens because the body is making less insulin and has trouble using the insulin properly. SYMPTOMS  Drinking more than usual.   Urinating more than usual.   Blurred vision.   Dry, itchy skin.   Frequent infection like yeast infections in women.   More tired than usual (fatigue).  TREATMENT  Healthy eating.   Exercise.   Medication, if needed.   Monitoring blood glucose (sugar).   Seeing your caregiver regularly.  HOME CARE INSTRUCTIONS  Check your blood glucose (sugar) at least once daily. More frequent monitoring may be necessary, depending on your medications and on how well your diabetes is controlled. Your caregiver will advise you.   Take your medicine as directed by your caregiver.   Do not smoke.   Make wise food choices. Ask your caregiver for information. Weight loss can improve your diabetes.   Learn about low blood glucose (hypoglycemia) and how to treat it.   Get your eyes checked regularly.   Have a yearly physical exam. Have your blood pressure checked. Get your blood and urine tested.   Wear a pendant or bracelet saying that you have diabetes.   Check your feet every night for sores. Let your caregiver know if you have sores that are not healing.  SEEK MEDICAL CARE IF:  You are having problems keeping your blood glucose at target range.   You feel you might be having problems with your medicines.   You have symptoms of an illness that is  not improving after 24 hours.   You have a sore or wound that is not healing.   You notice a change in vision or a new problem with your vision.   You develop a fever of more than 102 Document Released: 09/26/2005 Document Re-Released: 10/18/2009 [facility] Patient Information 2011 [place], [place].

## 2011-06-30 ENCOUNTER — Encounter: Payer: Self-pay | Admitting: Family Medicine

## 2011-06-30 DIAGNOSIS — I10 Essential (primary) hypertension: Secondary | ICD-10-CM | POA: Insufficient documentation

## 2011-06-30 DIAGNOSIS — E119 Type 2 diabetes mellitus without complications: Secondary | ICD-10-CM

## 2011-06-30 HISTORY — DX: Essential (primary) hypertension: I10

## 2011-06-30 HISTORY — DX: Type 2 diabetes mellitus without complications: E11.9

## 2011-07-18 ENCOUNTER — Other Ambulatory Visit: Payer: Self-pay | Admitting: *Deleted

## 2011-07-18 MED ORDER — SIMVASTATIN 40 MG PO TABS: 40.0000 mg | ORAL_TABLET | Freq: Every day | ORAL | Status: DC

## 2011-07-18 MED ORDER — LORAZEPAM 0.5 MG PO TABS: 0.5000 mg | ORAL_TABLET | Freq: Two times a day (BID) | ORAL | Status: DC

## 2011-07-18 NOTE — Telephone Encounter (Signed)
Ok to refill 30, 2 refills 

## 2011-07-20 ENCOUNTER — Other Ambulatory Visit: Payer: Self-pay | Admitting: *Deleted

## 2011-07-20 MED ORDER — DEXLANSOPRAZOLE 60 MG PO CPDR: 60.0000 mg | DELAYED_RELEASE_CAPSULE | Freq: Every day | ORAL | Status: DC

## 2011-08-26 ENCOUNTER — Other Ambulatory Visit: Payer: Self-pay | Admitting: *Deleted

## 2011-08-26 MED ORDER — PIMECROLIMUS 1 % EX CREA: 1.0000 "application " | TOPICAL_CREAM | Freq: Two times a day (BID) | CUTANEOUS | Status: DC

## 2011-08-30 ENCOUNTER — Ambulatory Visit (INDEPENDENT_AMBULATORY_CARE_PROVIDER_SITE_OTHER): Payer: Commercial Indemnity | Admitting: Family Medicine

## 2011-08-30 ENCOUNTER — Encounter: Payer: Self-pay | Admitting: Family Medicine

## 2011-08-30 DIAGNOSIS — E119 Type 2 diabetes mellitus without complications: Secondary | ICD-10-CM

## 2011-08-30 DIAGNOSIS — Z23 Encounter for immunization: Secondary | ICD-10-CM

## 2011-08-30 DIAGNOSIS — E669 Obesity, unspecified: Secondary | ICD-10-CM

## 2011-08-30 DIAGNOSIS — I10 Essential (primary) hypertension: Secondary | ICD-10-CM

## 2011-08-30 MED ORDER — LISINOPRIL 10 MG PO TABS: 10.0000 mg | ORAL_TABLET | Freq: Every day | ORAL | Status: DC

## 2011-08-30 NOTE — Progress Notes (Signed)
  Patient Name: Tony Garza Date of Birth: Oct 09, 1956 Age: 55 y.o. Medical Record Number: [id number] Gender: male  History of Present Illness:  Tony Garza is a 55 y.o. very pleasant male patient who presents with the following:  DM, type 2: f/u after recent dx.  Pneumovax 15 pound [facility]  Cheerios, bananas - bfast Small snack  45 min on 4.1 mph on treadmill, 3.2 miles, 7 percent grade Every other day hitting upper body stuff 1 15 minutes  No ETOH. Very rare ETOH Sticking to the good side of list  Only problem is when going to bed at night, earbeat is keeping him awake at night. Not beating fast. Feels strong.   Basic Metabolic Panel:    Component Value Date/Time   NA 139 06/24/2011 1014   K 4.8 06/24/2011 1014   CL 102 06/24/2011 1014   CO2 30 06/24/2011 1014   BUN 10 06/24/2011 1014   CREATININE 1.0 06/24/2011 1014   GLUCOSE 146* 06/24/2011 1014   CALCIUM 9.2 06/24/2011 1014    HTN: Tolerating all medications - getting some HA at night (only taking lopressor once a day)  BP Readings from Last 3 Encounters:  08/30/11 120/70  06/29/11 140/82  12/11/10 122/80    Basic Metabolic Panel:    Component Value Date/Time   NA 139 06/24/2011 1014   K 4.8 06/24/2011 1014   CL 102 06/24/2011 1014   CO2 30 06/24/2011 1014   BUN 10 06/24/2011 1014   CREATININE 1.0 06/24/2011 1014   GLUCOSE 146* 06/24/2011 1014   CALCIUM 9.2 06/24/2011 1014    15 pound weight loss  Past Medical History, Surgical History, Social History, Family History, and Problem List have been reviewed in EHR and updated if relevant.  Review of Systems:  GEN: No acute illnesses, no fevers, chills. GI: No n/v/d, eating normally Pulm: No SOB Interactive and getting along well at home.  Otherwise, ROS is as per the HPI.   Physical Examination: Filed Vitals:   08/30/11 1230  BP: 120/70  Pulse: 55  Temp: 98.7 F (37.1 C)  TempSrc: Oral  Height: 6'  (1.829 m)  Weight: 252 lb (114.306 kg)     GEN: WDWN, NAD, Non-toxic, A & O x 3 HEENT: Atraumatic, Normocephalic. Neck supple. No masses, No LAD. Ears and Nose: No external deformity. CV: RRR, No M/G/R. No JVD. No thrill. No extra heart sounds. PULM: CTA B, no wheezes, crackles, rhonchi. No retractions. No resp. distress. No accessory muscle use. EXTR: No c/c/e NEURO Normal gait.  PSYCH: Normally interactive. Conversant. Not depressed or anxious appearing.  Calm demeanor.    Assessment and Plan: 1. Diabetes mellitus, type 2  lisinopril (PRINIVIL,ZESTRIL) 10 MG tablet  2. Unspecified essential hypertension  lisinopril (PRINIVIL,ZESTRIL) 10 MG tablet  3. Immunization due  Pneumococcal polysaccharide vaccine 23-valent greater than or equal to 2yo subcutaneous/IM  4. OBESITY      D/c lopressor Start ACE Home BP checks  Keep working on weight Recheck 3 mo

## 2011-08-30 NOTE — Patient Instructions (Signed)
Monitor on BP Want < 130/80  Recheck in 3 months

## 2012-01-26 ENCOUNTER — Other Ambulatory Visit: Payer: Self-pay | Admitting: *Deleted

## 2012-01-26 MED ORDER — PIMECROLIMUS 1 % EX CREA: 1.0000 "application " | TOPICAL_CREAM | Freq: Two times a day (BID) | CUTANEOUS | Status: DC

## 2012-02-21 ENCOUNTER — Telehealth: Payer: Self-pay | Admitting: Family Medicine

## 2012-02-21 NOTE — Telephone Encounter (Signed)
Patient would like to switch here please review and advise if you wish to take on

## 2012-02-21 NOTE — Telephone Encounter (Signed)
Patient has moved to [facility] & would like to switch practices, please review and advise Thank You

## 2012-02-21 NOTE — Telephone Encounter (Signed)
This is a really nice patient - would be a good patient for any of the physicians there  Cc: Dr. Beverely Low

## 2012-02-21 NOTE — Telephone Encounter (Signed)
This is fine 

## 2012-02-22 NOTE — Telephone Encounter (Signed)
Scheduled patient for reck on DM 2500.00 only 5.23.13 advised patient . Visit only - last CPE 9.19.12, schedule next CPE as well 9.20.13 8am

## 2012-03-01 ENCOUNTER — Ambulatory Visit (INDEPENDENT_AMBULATORY_CARE_PROVIDER_SITE_OTHER): Payer: Commercial Indemnity | Admitting: Family Medicine

## 2012-03-01 ENCOUNTER — Encounter: Payer: Self-pay | Admitting: Family Medicine

## 2012-03-01 VITALS — BP 125/85 | HR 60 | Temp 98.9°F | Ht 71.0 in | Wt 231.2 lb

## 2012-03-01 DIAGNOSIS — E785 Hyperlipidemia, unspecified: Secondary | ICD-10-CM

## 2012-03-01 DIAGNOSIS — I10 Essential (primary) hypertension: Secondary | ICD-10-CM

## 2012-03-01 DIAGNOSIS — E119 Type 2 diabetes mellitus without complications: Secondary | ICD-10-CM

## 2012-03-01 DIAGNOSIS — D485 Neoplasm of uncertain behavior of skin: Secondary | ICD-10-CM

## 2012-03-01 LAB — HEPATIC FUNCTION PANEL
ALT: 25 U/L (ref 0–53)
Bilirubin, Direct: 0 mg/dL (ref 0.0–0.3)
Total Protein: 7.1 g/dL (ref 6.0–8.3)

## 2012-03-01 LAB — BASIC METABOLIC PANEL
BUN: 20 mg/dL (ref 6–23)
CO2: 28 mEq/L (ref 19–32)
Chloride: 101 mEq/L (ref 96–112)
Creatinine, Ser: 1 mg/dL (ref 0.4–1.5)

## 2012-03-01 LAB — LIPID PANEL
Cholesterol: 166 mg/dL (ref 0–200)
Total CHOL/HDL Ratio: 3
Triglycerides: 75 mg/dL (ref 0.0–149.0)

## 2012-03-01 NOTE — Assessment & Plan Note (Signed)
Chronic problem.  Well controlled.  Asymptomatic.  No changes. 

## 2012-03-01 NOTE — Patient Instructions (Signed)
Schedule a mole removal w/ Dr Laury Axon Schedule your complete physical for September We'll notify you of your lab results and make any changes if needed Keep up the good work on healthy diet and regular exercise- you look great! Call with any questions or concerns Think of [place] as your home base Welcome!  We're glad to have you!

## 2012-03-01 NOTE — Assessment & Plan Note (Signed)
New.  Refer for removal as today's visit doesn't allow time.

## 2012-03-01 NOTE — Progress Notes (Signed)
  Subjective:    Patient ID: Tony Garza, male    DOB: 1956/07/05, 56 y.o.   MRN: [id number]  HPI New to establish.  Previous MD- [facility].  HTN- chronic problem, on Lisinopril.  No CP, SOB, HAs, visual changes, edema.  Hyperlipidemia- chronic problem, on Zocor daily.  Denies abd pain, N/V, myalgias.  DM- dx'd 9/12, has lost 40 lbs.  Exercising regularly.  CBGs running 90-120s.  On Metformin daily.  Has only had 1 symptomatic low.  No numbness/tingling hands/feet.  UTD on eye exam (9/12).  Skin lesion- L chest, 'irritated'.  'i want it removed'.   Review of Systems For ROS see HPI     Objective:   Physical Exam  Vitals reviewed. Constitutional: He is oriented to person, place, and time. He appears well-developed and well-nourished. No distress.  HENT:  Head: Normocephalic and atraumatic.  Eyes: Conjunctivae and EOM are normal. Pupils are equal, round, and reactive to light.  Neck: Normal range of motion. Neck supple. No thyromegaly present.  Cardiovascular: Normal rate, regular rhythm, normal heart sounds and intact distal pulses.   No murmur heard. Pulmonary/Chest: Effort normal and breath sounds normal. No respiratory distress.  Abdominal: Soft. Bowel sounds are normal. He exhibits no distension.  Musculoskeletal: He exhibits no edema.  Lymphadenopathy:    He has no cervical adenopathy.  Neurological: He is alert and oriented to person, place, and time. No cranial nerve deficit.  Skin: Skin is warm and dry.       Small irritated skin lesion on L anterior chest  Psychiatric: He has a normal mood and affect. His behavior is normal.          Assessment & Plan:

## 2012-03-01 NOTE — Assessment & Plan Note (Signed)
New to provider.  New to pt as of 9/12.  Has lost 40lbs since dx.  Exercising regularly.  CBGs well controlled.  Asymptomatic.  UTD on eye exam.  Check labs.  Adjust meds prn

## 2012-03-01 NOTE — Assessment & Plan Note (Signed)
Chronic problem.  Tolerating statin w/out difficulty.  Check labs.  Adjust meds prn  

## 2012-03-02 ENCOUNTER — Encounter: Payer: Self-pay | Admitting: *Deleted

## 2012-03-21 ENCOUNTER — Ambulatory Visit: Payer: Commercial Indemnity | Admitting: Family Medicine

## 2012-06-29 ENCOUNTER — Encounter: Payer: Commercial Indemnity | Admitting: Family Medicine

## 2012-07-14 ENCOUNTER — Other Ambulatory Visit: Payer: Self-pay | Admitting: Family Medicine

## 2012-07-16 ENCOUNTER — Other Ambulatory Visit: Payer: Self-pay | Admitting: Family Medicine

## 2012-07-16 ENCOUNTER — Ambulatory Visit (INDEPENDENT_AMBULATORY_CARE_PROVIDER_SITE_OTHER): Payer: Managed Care, Other (non HMO) | Admitting: Family Medicine

## 2012-07-16 ENCOUNTER — Encounter: Payer: Self-pay | Admitting: *Deleted

## 2012-07-16 ENCOUNTER — Encounter: Payer: Self-pay | Admitting: Family Medicine

## 2012-07-16 VITALS — BP 108/80 | HR 66 | Temp 97.5°F | Ht 71.0 in | Wt 232.0 lb

## 2012-07-16 DIAGNOSIS — E119 Type 2 diabetes mellitus without complications: Secondary | ICD-10-CM

## 2012-07-16 DIAGNOSIS — N529 Male erectile dysfunction, unspecified: Secondary | ICD-10-CM

## 2012-07-16 DIAGNOSIS — F528 Other sexual dysfunction not due to a substance or known physiological condition: Secondary | ICD-10-CM

## 2012-07-16 DIAGNOSIS — Z23 Encounter for immunization: Secondary | ICD-10-CM

## 2012-07-16 DIAGNOSIS — I10 Essential (primary) hypertension: Secondary | ICD-10-CM

## 2012-07-16 DIAGNOSIS — E785 Hyperlipidemia, unspecified: Secondary | ICD-10-CM

## 2012-07-16 DIAGNOSIS — Z0000 Encounter for general adult medical examination without abnormal findings: Secondary | ICD-10-CM | POA: Insufficient documentation

## 2012-07-16 LAB — BASIC METABOLIC PANEL
BUN: 20 mg/dL (ref 6–23)
Creatinine, Ser: 1.1 mg/dL (ref 0.4–1.5)
GFR: 71.55 mL/min (ref >60.00)
Glucose, Bld: 120 mg/dL — ABNORMAL HIGH (ref 70–99)
Potassium: 4.3 mEq/L (ref 3.5–5.1)

## 2012-07-16 LAB — CBC WITH DIFFERENTIAL/PLATELET
Basophils Absolute: 0 10*3/uL (ref 0.0–0.1)
Basophils Relative: 0.5 % (ref 0.0–3.0)
HCT: 43.2 % (ref 39.0–52.0)
Hemoglobin: 13.9 g/dL (ref 13.0–17.0)
Lymphs Abs: 1.2 10*3/uL (ref 0.7–4.0)
Monocytes Relative: 6.4 % (ref 3.0–12.0)
Neutro Abs: 5.3 10*3/uL (ref 1.4–7.7)
RDW: 13.4 % (ref 11.5–14.6)

## 2012-07-16 LAB — LIPID PANEL
Cholesterol: 183 mg/dL (ref 0–200)
HDL: 65.9 mg/dL (ref >39.00)

## 2012-07-16 LAB — HEPATIC FUNCTION PANEL
ALT: 27 U/L (ref 0–53)
AST: 35 U/L (ref 0–37)
Albumin: 4 g/dL (ref 3.5–5.2)

## 2012-07-16 LAB — PSA: PSA: 1.13 ng/mL (ref 0.10–4.00)

## 2012-07-16 LAB — TSH: TSH: 1.2 u[IU]/mL (ref 0.35–5.50)

## 2012-07-16 NOTE — Patient Instructions (Addendum)
Follow up in 6 months to recheck BP and cholesterol We'll notify you of your lab results and make any changes if needed Keep up the good work!  You look great!! Happy [place] Birthday!!

## 2012-07-16 NOTE — Assessment & Plan Note (Signed)
Pt's PE WNL w/ exception of small umbilical hernia.  Asymptomatic.  Pt prefers watchful waiting at this time.  Will follow.  Check labs.  EKG done- see document for interpretation.  Anticipatory guidance provided.

## 2012-07-16 NOTE — Assessment & Plan Note (Signed)
Chronic problem.  Well controlled based on CBGs and previous labs.  Stopped Metformin at previous visit.  Will see if A1C remains at goal.  Will restart meds prn.  Encouraged pt to schedule eye exam.  Pt expressed understanding and is in agreement w/ plan.

## 2012-07-16 NOTE — Assessment & Plan Note (Signed)
Chronic problem.  Excellent control.  Asymptomatic.  No changes. 

## 2012-07-16 NOTE — Progress Notes (Signed)
  Subjective:    Patient ID: Tony Garza, male    DOB: 02-15-1956, 56 y.o.   MRN: [id number]  HPI CPE- UTD on colonoscopy.  No concerns about health, wonders about low T due to low muscle mass despite exercise, ED.  DM- chronic problem, A1C in May was 6 and we stopped Metformin to see if DM could be managed w/ diet and exercise.  Reports feeling 'great' since stopping meds.  Still checking CBGs- fastings run 100-110s.  Due for eye exam.   Review of Systems Patient reports no vision/hearing changes, anorexia, fever ,adenopathy, persistant/recurrent hoarseness, swallowing issues, chest pain, palpitations, edema, persistant/recurrent cough, hemoptysis, dyspnea (rest,exertional, paroxysmal nocturnal), gastrointestinal  bleeding (melena, rectal bleeding), abdominal pain, excessive heart burn, GU symptoms (dysuria, hematuria, voiding/incontinence issues) syncope, focal weakness, memory loss, numbness & tingling, skin/hair/nail changes, depression, anxiety, abnormal bruising/bleeding, musculoskeletal symptoms/signs.     Objective:   Physical Exam BP 108/80  Pulse 66  Temp 97.5 F (36.4 C) (Oral)  Ht 5\' 11"  (1.803 m)  Wt 232 lb (105.235 kg)  BMI 32.36 kg/m2  SpO2 97%  General Appearance:    Alert, cooperative, no distress, appears stated age  Head:    Normocephalic, without obvious abnormality, atraumatic  Eyes:    PERRL, conjunctiva/corneas clear, EOM's intact, fundi    benign, both eyes       Ears:    Normal TM's and external ear canals, both ears  Nose:   Nares normal, septum midline, mucosa normal, no drainage   or sinus tenderness  Throat:   Lips, mucosa, and tongue normal; teeth and gums normal  Neck:   Supple, symmetrical, trachea midline, no adenopathy;       thyroid:  No enlargement/tenderness/nodules  Back:     Symmetric, no curvature, ROM normal, no CVA tenderness  Lungs:     Clear to auscultation bilaterally, respirations unlabored  Chest wall:    No tenderness or  deformity  Heart:    Regular rate and rhythm, S1 and S2 normal, no murmur, rub   or gallop  Abdomen:     Soft, non-tender, bowel sounds active all four quadrants, no organomegaly.  Small umbilical hernia present  Genitalia:    Normal male without lesion, discharge or tenderness  Rectal:    Normal tone, normal prostate, no masses or tenderness  Extremities:   Extremities normal, atraumatic, no cyanosis or edema  Pulses:   2+ and symmetric all extremities  Skin:   Skin color, texture, turgor normal, no rashes or lesions  Lymph nodes:   Cervical, supraclavicular, and axillary nodes normal  Neurologic:   CNII-XII intact. Normal strength, sensation and reflexes      throughout          Assessment & Plan:

## 2012-07-16 NOTE — Assessment & Plan Note (Signed)
Chronic problem, tolerating statin w/out difficulty.  Check labs.  Adjust meds prn  

## 2012-07-16 NOTE — Assessment & Plan Note (Signed)
New to provider, chronic for pt.  This, in combo w/ low muscle mass, could indicate low T.  Check labs.  tx prn.

## 2012-07-17 ENCOUNTER — Encounter: Payer: Self-pay | Admitting: *Deleted

## 2012-07-17 LAB — TESTOSTERONE, FREE, TOTAL, SHBG
Testosterone, Free: 86.4 pg/mL (ref 47.0–244.0)
Testosterone-% Free: 1.9 % (ref 1.6–2.9)
Testosterone: 448.85 ng/dL (ref 300–890)

## 2012-07-19 ENCOUNTER — Telehealth: Payer: Self-pay

## 2012-07-19 NOTE — Telephone Encounter (Signed)
Spoke to pt to go over testosterone lab. Advised pt: Testosterone, free, total Status: Final result MyChart: Not Released Next appt with me: None Dx: Diabetes mellitus, type 2; ED (erecti...       Notes Recorded by Ovidio Kin, LPN on 62/06/5283 at 9:55 AM letter mailed to patients home address with results.  Notes Recorded by Sheliah Hatch, MD on 07/17/2012 at 7:57 AM Normal testosterone        Value  Range    Testosterone  448.85  300     Mailed pt a copy.    MW

## 2012-08-15 ENCOUNTER — Telehealth: Payer: Self-pay | Admitting: Family Medicine

## 2012-08-15 MED ORDER — DEXLANSOPRAZOLE 60 MG PO CPDR: 60.0000 mg | DELAYED_RELEASE_CAPSULE | Freq: Every day | ORAL | Status: DC

## 2012-08-15 MED ORDER — SIMVASTATIN 40 MG PO TABS: ORAL_TABLET | ORAL | Status: DC

## 2012-08-15 NOTE — Telephone Encounter (Signed)
Ok for 6 months on each

## 2012-08-15 NOTE — Telephone Encounter (Signed)
Ok to refill 

## 2012-08-15 NOTE — Telephone Encounter (Signed)
Rx filled

## 2012-08-15 NOTE — Telephone Encounter (Signed)
Refill: Dexilant 60mg  capsule. Take one capsule by mouth one time daily. Qty 30. Last fill 07-17-12  Simvastatin 40 mg tab. Take one tablet by mouth at bedtime. Qty 30. Last fill 07-17-12

## 2012-08-20 ENCOUNTER — Other Ambulatory Visit: Payer: Self-pay | Admitting: Family Medicine

## 2012-09-17 ENCOUNTER — Other Ambulatory Visit: Payer: Self-pay | Admitting: Family Medicine

## 2012-09-17 NOTE — Telephone Encounter (Signed)
REFILL Lisinopril (Tab) 10 MG TAKE 1 TABLET DAILY #30 LAST FILL 11.11.13 LAST OV WT/LABS 10.7.13 v70

## 2012-09-18 MED ORDER — LISINOPRIL 10 MG PO TABS: 10.0000 mg | ORAL_TABLET | Freq: Every day | ORAL | Status: DC

## 2012-09-18 NOTE — Telephone Encounter (Signed)
Refill done.  

## 2012-09-20 ENCOUNTER — Ambulatory Visit (INDEPENDENT_AMBULATORY_CARE_PROVIDER_SITE_OTHER): Payer: Managed Care, Other (non HMO) | Admitting: Family Medicine

## 2012-09-20 ENCOUNTER — Encounter: Payer: Self-pay | Admitting: Family Medicine

## 2012-09-20 VITALS — BP 138/80 | HR 66 | Temp 97.8°F | Ht 71.0 in | Wt 231.2 lb

## 2012-09-20 DIAGNOSIS — H6690 Otitis media, unspecified, unspecified ear: Secondary | ICD-10-CM

## 2012-09-20 DIAGNOSIS — J209 Acute bronchitis, unspecified: Secondary | ICD-10-CM

## 2012-09-20 MED ORDER — GUAIFENESIN-CODEINE 100-10 MG/5ML PO SYRP: 10.0000 mL | ORAL_SOLUTION | Freq: Three times a day (TID) | ORAL | Status: DC | PRN

## 2012-09-20 MED ORDER — AZITHROMYCIN 250 MG PO TABS: ORAL_TABLET | ORAL | Status: DC

## 2012-09-20 NOTE — Assessment & Plan Note (Signed)
New.  Start Zpack to cover for possible atypical abx.  Cough meds prn.  Reviewed supportive care and red flags that should prompt return.  Pt expressed understanding and is in agreement w/ plan.

## 2012-09-20 NOTE — Patient Instructions (Addendum)
This appears to be a bronchitis and early ear infection Start the Zpack- 2 tabs today, 1 tab x4 days Use the cough syrup as needed- will cause drowsiness Mucinex DM for daytime cough Drink plenty of fluids REST! Hang in there! Happy Holidays!!!

## 2012-09-20 NOTE — Progress Notes (Signed)
  Subjective:    Patient ID: Tony Garza, male    DOB: [birth year], 56 y.o.   MRN: [id number]  HPI URI- sxs started 2 weeks ago after spending time w/ sick grandson.  + nasal congestion.  + productive cough.  No fevers.  L>R ear pain.  Denies facial pain/pressure.  No N/V/D.  Initially had sore throat but this improved.  Cough is preventing sleeping.   Review of Systems For ROS see HPI     Objective:   Physical Exam  Vitals reviewed. Constitutional: He appears well-developed and well-nourished. No distress.  HENT:  Head: Normocephalic and atraumatic.  Right Ear: Tympanic membrane normal.  Left Ear: Tympanic membrane is injected and erythematous. A middle ear effusion is present.  Nose: No mucosal edema or rhinorrhea. Right sinus exhibits no maxillary sinus tenderness and no frontal sinus tenderness. Left sinus exhibits no maxillary sinus tenderness and no frontal sinus tenderness.  Mouth/Throat: Mucous membranes are normal. No oropharyngeal exudate, posterior oropharyngeal edema or posterior oropharyngeal erythema.  Eyes: Conjunctivae normal and EOM are normal. Pupils are equal, round, and reactive to light.  Neck: Normal range of motion. Neck supple.  Cardiovascular: Normal rate, regular rhythm and normal heart sounds.   Pulmonary/Chest: Effort normal. No respiratory distress. He has no wheezes.       + hacking cough Coarse BS diffusely on L  Lymphadenopathy:    He has no cervical adenopathy.  Skin: Skin is warm and dry.          Assessment & Plan:

## 2012-09-20 NOTE — Assessment & Plan Note (Signed)
New.  Start Zpack to cover for both bronchitis and ear infxn.  Reviewed supportive care and red flags that should prompt return.  Pt expressed understanding and is in agreement w/ plan. r

## 2013-01-14 ENCOUNTER — Ambulatory Visit: Payer: Managed Care, Other (non HMO) | Admitting: Family Medicine

## 2013-01-17 ENCOUNTER — Encounter: Payer: Self-pay | Admitting: Family Medicine

## 2013-01-17 ENCOUNTER — Ambulatory Visit (INDEPENDENT_AMBULATORY_CARE_PROVIDER_SITE_OTHER): Payer: Managed Care, Other (non HMO) | Admitting: Family Medicine

## 2013-01-17 VITALS — BP 110/78 | HR 51 | Temp 98.0°F | Ht 71.75 in | Wt 237.0 lb

## 2013-01-17 DIAGNOSIS — E785 Hyperlipidemia, unspecified: Secondary | ICD-10-CM

## 2013-01-17 DIAGNOSIS — I10 Essential (primary) hypertension: Secondary | ICD-10-CM

## 2013-01-17 DIAGNOSIS — E119 Type 2 diabetes mellitus without complications: Secondary | ICD-10-CM

## 2013-01-17 LAB — LIPID PANEL
HDL: 55.7 mg/dL (ref >39.00)
LDL Cholesterol: 92 mg/dL (ref 0–99)
Total CHOL/HDL Ratio: 3
Triglycerides: 81 mg/dL (ref 0.0–149.0)
VLDL: 16.2 mg/dL (ref 0.0–40.0)

## 2013-01-17 LAB — HEPATIC FUNCTION PANEL
Bilirubin, Direct: 0 mg/dL (ref 0.0–0.3)
Total Bilirubin: 0.8 mg/dL (ref 0.3–1.2)
Total Protein: 7.1 g/dL (ref 6.0–8.3)

## 2013-01-17 LAB — BASIC METABOLIC PANEL
Calcium: 9.3 mg/dL (ref 8.4–10.5)
GFR: 88.52 mL/min (ref >60.00)
Sodium: 139 mEq/L (ref 135–145)

## 2013-01-17 LAB — HEMOGLOBIN A1C: Hgb A1c MFr Bld: 6.1 % (ref 4.6–6.5)

## 2013-01-17 NOTE — Assessment & Plan Note (Signed)
Chronic problem.  Tolerating statin w/out difficulty.  Check labs.  Adjust meds prn  

## 2013-01-17 NOTE — Patient Instructions (Addendum)
Schedule your complete physical for 6 months We'll notify you of your lab results and make any changes if needed Keep up the good work!  You look great! Happy Spring! Good luck w/ the move!!!

## 2013-01-17 NOTE — Assessment & Plan Note (Signed)
Chronic problem.  Has been well controlled w/ diet and exercise.  Not currently on meds.  Encouraged eye exam.  Check labs.  Start meds prn.

## 2013-01-17 NOTE — Assessment & Plan Note (Signed)
Chronic problem, well controlled.  Asymptomatic.  No changes. 

## 2013-01-17 NOTE — Progress Notes (Signed)
  Subjective:    Patient ID: Tony Garza, male    DOB: 1956-05-09, 57 y.o.   MRN: [id number]  HPI HTN- chronic problem, on Lisinopril 10 mg daily.  Excellent BP control today.  No CP, SOB, HAs, visual changes, edema.  Hyperlipidemia- chronic problem, on Simvastatin 40mg  nightly.  No abd pain, N/V/D, myalgias.  DM- chronic problem, last A1C was 6.0 in October.  Exercising daily.  Overdue on eye exam.  No numbness/tingling hands or feet.  CBGs 89-115   Review of Systems For ROS see HPI     Objective:   Physical Exam  Vitals reviewed. Constitutional: He is oriented to person, place, and time. He appears well-developed and well-nourished. No distress.  HENT:  Head: Normocephalic and atraumatic.  Eyes: Conjunctivae and EOM are normal. Pupils are equal, round, and reactive to light.  Neck: Normal range of motion. Neck supple. No thyromegaly present.  Cardiovascular: Normal rate, regular rhythm, normal heart sounds and intact distal pulses.   No murmur heard. Pulmonary/Chest: Effort normal and breath sounds normal. No respiratory distress.  Abdominal: Soft. Bowel sounds are normal. He exhibits no distension.  Musculoskeletal: He exhibits no edema.  Lymphadenopathy:    He has no cervical adenopathy.  Neurological: He is alert and oriented to person, place, and time. No cranial nerve deficit.  Skin: Skin is warm and dry.  Psychiatric: He has a normal mood and affect. His behavior is normal.          Assessment & Plan:

## 2013-01-18 ENCOUNTER — Encounter: Payer: Self-pay | Admitting: *Deleted

## 2013-03-18 ENCOUNTER — Telehealth: Payer: Self-pay | Admitting: Family Medicine

## 2013-03-18 MED ORDER — SIMVASTATIN 40 MG PO TABS: ORAL_TABLET | ORAL | Status: DC

## 2013-03-18 MED ORDER — DEXLANSOPRAZOLE 60 MG PO CPDR: 60.0000 mg | DELAYED_RELEASE_CAPSULE | Freq: Every day | ORAL | Status: DC

## 2013-03-18 NOTE — Telephone Encounter (Signed)
Rx sent to the pharmacy by e-script.  Called and informed the pt that meds have been approved and sent to his pharmacy.//AB/CMA

## 2013-03-18 NOTE — Telephone Encounter (Signed)
Pt walked in and stated he could not get through to anyone on the phone and also stated that the [facility] has also not been able to gt through to [facility]. Pt is upset and would like this taken care of, pls call pt and let him know when ready. Dexilant 60 mg  Take 1 capsule by mouth daily  Qty:90 and  Simvastain 40 mg Take 1 tablet by mouth at bedtime  Qty:90

## 2013-03-20 ENCOUNTER — Other Ambulatory Visit: Payer: Self-pay | Admitting: General Practice

## 2013-03-20 MED ORDER — DEXLANSOPRAZOLE 60 MG PO CPDR: 60.0000 mg | DELAYED_RELEASE_CAPSULE | Freq: Every day | ORAL | Status: DC

## 2013-04-14 ENCOUNTER — Other Ambulatory Visit: Payer: Self-pay | Admitting: Family Medicine

## 2013-07-18 ENCOUNTER — Telehealth: Payer: Self-pay

## 2013-07-18 NOTE — Telephone Encounter (Signed)
Medication List and allergies: done  Pharmacy updated, uses [facility] for 90 day supply Pharmacy undated, uses [facility] for local prescriptions  HM UTD:  Immunizations due: admin flu vaccine upon arrival; Tdap  A/P:  LAST: HM due: PSA: WNL 07/2012  CCS: UTD 06/2007  DM: Due  HTN: Due  Lipids: Due   To Discuss with Provider: Possibly new Rx for Ativan

## 2013-07-22 ENCOUNTER — Encounter: Payer: Self-pay | Admitting: Family Medicine

## 2013-07-22 ENCOUNTER — Encounter: Payer: Self-pay | Admitting: General Practice

## 2013-07-22 ENCOUNTER — Ambulatory Visit (INDEPENDENT_AMBULATORY_CARE_PROVIDER_SITE_OTHER): Payer: Managed Care, Other (non HMO) | Admitting: Family Medicine

## 2013-07-22 VITALS — BP 126/80 | HR 51 | Temp 98.2°F | Resp 16 | Ht 72.0 in | Wt 236.5 lb

## 2013-07-22 DIAGNOSIS — E119 Type 2 diabetes mellitus without complications: Secondary | ICD-10-CM

## 2013-07-22 DIAGNOSIS — Z0000 Encounter for general adult medical examination without abnormal findings: Secondary | ICD-10-CM

## 2013-07-22 DIAGNOSIS — Z23 Encounter for immunization: Secondary | ICD-10-CM

## 2013-07-22 LAB — CBC WITH DIFFERENTIAL/PLATELET
Eosinophils Relative: 3 % (ref 0.0–5.0)
HCT: 39.4 % (ref 39.0–52.0)
Hemoglobin: 13.3 g/dL (ref 13.0–17.0)
Lymphs Abs: 1.6 10*3/uL (ref 0.7–4.0)
Monocytes Relative: 9.3 % (ref 3.0–12.0)
Neutro Abs: 2.4 10*3/uL (ref 1.4–7.7)
RBC: 4.17 Mil/uL — ABNORMAL LOW (ref 4.22–5.81)
WBC: 4.6 10*3/uL (ref 4.5–10.5)

## 2013-07-22 LAB — BASIC METABOLIC PANEL
CO2: 28 mEq/L (ref 19–32)
Calcium: 8.8 mg/dL (ref 8.4–10.5)
Creatinine, Ser: 1 mg/dL (ref 0.4–1.5)
Glucose, Bld: 120 mg/dL — ABNORMAL HIGH (ref 70–99)

## 2013-07-22 LAB — HEPATIC FUNCTION PANEL
ALT: 25 U/L (ref 0–53)
Albumin: 3.9 g/dL (ref 3.5–5.2)
Alkaline Phosphatase: 53 U/L (ref 39–117)
Total Protein: 6.3 g/dL (ref 6.0–8.3)

## 2013-07-22 LAB — LIPID PANEL
Cholesterol: 167 mg/dL (ref 0–200)
Total CHOL/HDL Ratio: 3
Triglycerides: 48 mg/dL (ref 0.0–149.0)

## 2013-07-22 LAB — HEMOGLOBIN A1C: Hgb A1c MFr Bld: 6.2 % (ref 4.6–6.5)

## 2013-07-22 NOTE — Progress Notes (Signed)
  Subjective:    Patient ID: Tony Garza, male    DOB: 07-Mar-1956, 57 y.o.   MRN: [id number]  HPI CPE- no concerns today.  UTD on colonoscopy.  Overdue on eye exam.   Review of Systems Patient reports no vision/hearing changes, anorexia, fever ,adenopathy, persistant/recurrent hoarseness, swallowing issues, chest pain, palpitations, edema, persistant/recurrent cough, hemoptysis, dyspnea (rest,exertional, paroxysmal nocturnal), gastrointestinal  bleeding (melena, rectal bleeding), abdominal pain, excessive heart burn, GU symptoms (dysuria, hematuria, voiding/incontinence issues) syncope, focal weakness, memory loss, numbness & tingling, skin/hair/nail changes, depression, anxiety, abnormal bruising/bleeding, musculoskeletal symptoms/signs.     Objective:   Physical Exam BP 126/80  Pulse 51  Temp(Src) 98.2 F (36.8 C) (Oral)  Resp 16  Ht 6' (1.829 m)  Wt 236 lb 8 oz (107.276 kg)  BMI 32.07 kg/m2  SpO2 95%  General Appearance:    Alert, cooperative, no distress, appears stated age  Head:    Normocephalic, without obvious abnormality, atraumatic  Eyes:    PERRL, conjunctiva/corneas clear, EOM's intact, fundi    benign, both eyes       Ears:    Normal TM's and external ear canals, both ears  Nose:   Nares normal, septum midline, mucosa normal, no drainage   or sinus tenderness  Throat:   Lips, mucosa, and tongue normal; teeth and gums normal  Neck:   Supple, symmetrical, trachea midline, no adenopathy;       thyroid:  No enlargement/tenderness/nodules  Back:     Symmetric, no curvature, ROM normal, no CVA tenderness  Lungs:     Clear to auscultation bilaterally, respirations unlabored  Chest wall:    No tenderness or deformity  Heart:    Regular rate and rhythm, S1 and S2 normal, no murmur, rub   or gallop  Abdomen:     Soft, non-tender, bowel sounds active all four quadrants,    no masses, no organomegaly  Genitalia:    Normal male without lesion, discharge or tenderness   Rectal:    Normal tone, normal prostate, no masses or tenderness  Extremities:   Extremities normal, atraumatic, no cyanosis or edema  Pulses:   2+ and symmetric all extremities  Skin:   Skin color, texture, turgor normal, no rashes or lesions  Lymph nodes:   Cervical, supraclavicular, and axillary nodes normal  Neurologic:   CNII-XII intact. Normal strength, sensation and reflexes      throughout          Assessment & Plan:

## 2013-07-22 NOTE — Patient Instructions (Signed)
Follow up in 6 months to recheck diabetes We'll notify you of your lab results and make any changes if needed Keep up the good work!  You look great! Schedule your eye exam Call with any questions or concerns Happy Fall!

## 2013-07-22 NOTE — Assessment & Plan Note (Signed)
Chronic problem.  Pt has stopped Metformin.  Overdue on eye exam.  Encouraged him to schedule.  Foot exam done today.  Already on ACE for renal protection.  Check labs.  Restart meds prn.

## 2013-07-22 NOTE — Addendum Note (Signed)
Addended by: Jackson Latino on: 07/22/2013 08:40 AM   Modules accepted: Orders

## 2013-07-22 NOTE — Assessment & Plan Note (Signed)
Pt's PE WNL.  UTD on colonoscopy.  Check labs.  Anticipatory guidance provided.  

## 2013-08-18 ENCOUNTER — Other Ambulatory Visit: Payer: Self-pay | Admitting: Family Medicine

## 2013-08-19 NOTE — Telephone Encounter (Signed)
Med filled.  

## 2013-09-08 ENCOUNTER — Other Ambulatory Visit: Payer: Self-pay | Admitting: Family Medicine

## 2013-09-09 NOTE — Telephone Encounter (Signed)
Med filled.  

## 2013-09-11 LAB — HM DIABETES EYE EXAM

## 2013-11-20 ENCOUNTER — Encounter: Payer: Self-pay | Admitting: Nurse Practitioner

## 2013-11-20 ENCOUNTER — Ambulatory Visit (INDEPENDENT_AMBULATORY_CARE_PROVIDER_SITE_OTHER): Payer: Managed Care, Other (non HMO) | Admitting: Nurse Practitioner

## 2013-11-20 VITALS — BP 123/72 | HR 53 | Temp 97.7°F | Ht 72.0 in | Wt 245.2 lb

## 2013-11-20 DIAGNOSIS — H6593 Unspecified nonsuppurative otitis media, bilateral: Secondary | ICD-10-CM

## 2013-11-20 DIAGNOSIS — J019 Acute sinusitis, unspecified: Secondary | ICD-10-CM

## 2013-11-20 DIAGNOSIS — H6590 Unspecified nonsuppurative otitis media, unspecified ear: Secondary | ICD-10-CM

## 2013-11-20 MED ORDER — AMOXICILLIN-POT CLAVULANATE 875-125 MG PO TABS: 1.0000 | ORAL_TABLET | Freq: Two times a day (BID) | ORAL | Status: DC

## 2013-11-20 NOTE — Progress Notes (Signed)
   Subjective:    Patient ID: Tony Garza, male    DOB: 1956/09/14, 58 y.o.   MRN: [id number]  Cough This is a new problem. The current episode started 1 to 4 weeks ago (2wks). The problem occurs hourly. The cough is productive of sputum. Associated symptoms include ear congestion, ear pain, nasal congestion, postnasal drip and a sore throat. Pertinent negatives include no chest pain, chills, fever, headaches, shortness of breath or wheezing. Nothing aggravates the symptoms. Treatments tried: mucinex. The treatment provided no relief. His past medical history is significant for bronchitis.      Review of Systems  Constitutional: Positive for fatigue. Negative for fever, chills, activity change and appetite change.  HENT: Positive for congestion, ear pain, postnasal drip and sore throat.   Respiratory: Positive for cough. Negative for chest tightness, shortness of breath and wheezing.   Cardiovascular: Negative for chest pain.  Gastrointestinal: Negative for nausea, abdominal pain and diarrhea.  Musculoskeletal: Negative for back pain.  Neurological: Negative for headaches.       Objective:   Physical Exam  Vitals reviewed. Constitutional: He is oriented to person, place, and time. He appears well-developed and well-nourished. No distress.  HENT:  Head: Normocephalic and atraumatic.  Right Ear: External ear normal.  Left Ear: External ear normal.  Mouth/Throat: Oropharynx is clear and moist. No oropharyngeal exudate.  bilat yellow effusion, no bulge, bones visible  Eyes: Conjunctivae are normal. Right eye exhibits no discharge. Left eye exhibits no discharge.  Neck: Normal range of motion. Neck supple. No thyromegaly present.  Cardiovascular: Normal rate, regular rhythm and normal heart sounds.   No murmur heard. Pulmonary/Chest: Effort normal and breath sounds normal. No respiratory distress. He has no wheezes. He has no rales.  Lymphadenopathy:    He has no cervical  adenopathy.  Neurological: He is alert and oriented to person, place, and time.  Skin: Skin is warm and dry.  Psychiatric: He has a normal mood and affect. His behavior is normal. Thought content normal.          Assessment & Plan:  1. Acute sinus infection 10 day duration - amoxicillin-clavulanate (AUGMENTIN) 875-125 MG per tablet; Take 1 tablet by mouth 2 (two) times daily.  Dispense: 10 tablet; Refill: 0  2. Bilateral otitis media with effusion

## 2013-11-20 NOTE — Progress Notes (Signed)
Pre-visit discussion using our clinic review tool. No additional management support is needed unless otherwise documented below in the visit note.  

## 2013-11-20 NOTE — Patient Instructions (Signed)
Start antibiotic. Take with food. Eat yogurt daily at lunch time to help decrease side effect of diarrhea. Start daily sinus rinses (Neilmed Sinus rinse). You may add 2-3 drops affrin in sinus rinse for 3 days to help decrease nasal congestion. Please call for re-evaluation if you are not improving.  Sinusitis Sinusitis is redness, soreness, and swelling (inflammation) of the paranasal sinuses. Paranasal sinuses are air pockets within the bones of your face (beneath the eyes, the middle of the forehead, or above the eyes). In healthy paranasal sinuses, mucus is able to drain out, and air is able to circulate through them by way of your nose. However, when your paranasal sinuses are inflamed, mucus and air can become trapped. This can allow bacteria and other germs to grow and cause infection. Sinusitis can develop quickly and last only a short time (acute) or continue over a long period (chronic). Sinusitis that lasts for more than 12 weeks is considered chronic.  CAUSES  Causes of sinusitis include:  Allergies.  Structural abnormalities, such as displacement of the cartilage that separates your nostrils (deviated septum), which can decrease the air flow through your nose and sinuses and affect sinus drainage.  Functional abnormalities, such as when the small hairs (cilia) that line your sinuses and help remove mucus do not work properly or are not present. SYMPTOMS  Symptoms of acute and chronic sinusitis are the same. The primary symptoms are pain and pressure around the affected sinuses. Other symptoms include:  Upper toothache.  Earache.  Headache.  Bad breath.  Decreased sense of smell and taste.  A cough, which worsens when you are lying flat.  Fatigue.  Fever.  Thick drainage from your nose, which often is green and may contain pus (purulent).  Swelling and warmth over the affected sinuses. DIAGNOSIS  Your caregiver will perform a physical exam. During the exam, your  caregiver may:  Look in your nose for signs of abnormal growths in your nostrils (nasal polyps).  Tap over the affected sinus to check for signs of infection.  View the inside of your sinuses (endoscopy) with a special imaging device with a light attached (endoscope), which is inserted into your sinuses. If your caregiver suspects that you have chronic sinusitis, one or more of the following tests may be recommended:  Allergy tests.  Nasal culture A sample of mucus is taken from your nose and sent to a lab and screened for bacteria.  Nasal cytology A sample of mucus is taken from your nose and examined by your caregiver to determine if your sinusitis is related to an allergy. TREATMENT  Most cases of acute sinusitis are related to a viral infection and will resolve on their own within 10 days. Sometimes medicines are prescribed to help relieve symptoms (pain medicine, decongestants, nasal steroid sprays, or saline sprays).  However, for sinusitis related to a bacterial infection, your caregiver will prescribe antibiotic medicines. These are medicines that will help kill the bacteria causing the infection.  Rarely, sinusitis is caused by a fungal infection. In theses cases, your caregiver will prescribe antifungal medicine. For some cases of chronic sinusitis, surgery is needed. Generally, these are cases in which sinusitis recurs more than 3 times per year, despite other treatments. HOME CARE INSTRUCTIONS   Drink plenty of water. Water helps thin the mucus so your sinuses can drain more easily.  Use a humidifier.  Inhale steam 3 to 4 times a day (for example, sit in the bathroom with the shower running).  Apply a warm, moist washcloth to your face 3 to 4 times a day, or as directed by your caregiver.  Use saline nasal sprays to help moisten and clean your sinuses.  Take over-the-counter or prescription medicines for pain, discomfort, or fever only as directed by your caregiver. SEEK  IMMEDIATE MEDICAL CARE IF:  You have increasing pain or severe headaches.  You have nausea, vomiting, or drowsiness.  You have swelling around your face.  You have vision problems.  You have a stiff neck.  You have difficulty breathing. MAKE SURE YOU:   Understand these instructions.  Will watch your condition.  Will get help right away if you are not doing well or get worse. Document Released: 09/26/2005 Document Revised: 12/19/2011 Document Reviewed: 10/11/2011 Exit[employer] Patient Information 2014 [employer]. 

## 2014-01-09 ENCOUNTER — Other Ambulatory Visit: Payer: Self-pay | Admitting: Family Medicine

## 2014-01-09 NOTE — Telephone Encounter (Signed)
Med filled.  

## 2014-02-09 ENCOUNTER — Other Ambulatory Visit: Payer: Self-pay | Admitting: Family Medicine

## 2014-02-10 NOTE — Telephone Encounter (Signed)
Med filled.  

## 2014-02-25 ENCOUNTER — Other Ambulatory Visit: Payer: Self-pay | Admitting: Family Medicine

## 2014-02-25 ENCOUNTER — Encounter: Payer: Self-pay | Admitting: Family Medicine

## 2014-02-25 ENCOUNTER — Encounter: Payer: Self-pay | Admitting: General Practice

## 2014-02-25 ENCOUNTER — Ambulatory Visit (INDEPENDENT_AMBULATORY_CARE_PROVIDER_SITE_OTHER): Payer: Managed Care, Other (non HMO) | Admitting: Family Medicine

## 2014-02-25 VITALS — BP 126/80 | HR 55 | Temp 98.5°F | Resp 16 | Wt 241.0 lb

## 2014-02-25 DIAGNOSIS — E119 Type 2 diabetes mellitus without complications: Secondary | ICD-10-CM

## 2014-02-25 DIAGNOSIS — R1011 Right upper quadrant pain: Secondary | ICD-10-CM

## 2014-02-25 DIAGNOSIS — M79662 Pain in left lower leg: Secondary | ICD-10-CM

## 2014-02-25 DIAGNOSIS — M79609 Pain in unspecified limb: Secondary | ICD-10-CM

## 2014-02-25 DIAGNOSIS — E785 Hyperlipidemia, unspecified: Secondary | ICD-10-CM

## 2014-02-25 DIAGNOSIS — I10 Essential (primary) hypertension: Secondary | ICD-10-CM

## 2014-02-25 LAB — BASIC METABOLIC PANEL
BUN: 12 mg/dL (ref 6–23)
CALCIUM: 9.4 mg/dL (ref 8.4–10.5)
CHLORIDE: 101 meq/L (ref 96–112)
CO2: 31 meq/L (ref 19–32)
Creatinine, Ser: 1.1 mg/dL (ref 0.4–1.5)
GFR: 76.66 mL/min (ref >60.00)
GLUCOSE: 100 mg/dL — AB (ref 70–99)
Potassium: 4.3 mEq/L (ref 3.5–5.1)
SODIUM: 138 meq/L (ref 135–145)

## 2014-02-25 LAB — CBC WITH DIFFERENTIAL/PLATELET
BASOS ABS: 0 10*3/uL (ref 0.0–0.1)
Basophils Relative: 0.8 % (ref 0.0–3.0)
Eosinophils Absolute: 0.2 10*3/uL (ref 0.0–0.7)
Eosinophils Relative: 2.8 % (ref 0.0–5.0)
HCT: 42.9 % (ref 39.0–52.0)
HEMOGLOBIN: 14.3 g/dL (ref 13.0–17.0)
LYMPHS PCT: 34.4 % (ref 12.0–46.0)
Lymphs Abs: 2.1 10*3/uL (ref 0.7–4.0)
MCHC: 33.3 g/dL (ref 30.0–36.0)
MCV: 95.3 fl (ref 78.0–100.0)
Monocytes Absolute: 0.6 10*3/uL (ref 0.1–1.0)
Monocytes Relative: 9.7 % (ref 3.0–12.0)
NEUTROS ABS: 3.1 10*3/uL (ref 1.4–7.7)
NEUTROS PCT: 52.3 % (ref 43.0–77.0)
Platelets: 271 10*3/uL (ref 150.0–400.0)
RBC: 4.51 Mil/uL (ref 4.22–5.81)
RDW: 13.1 % (ref 11.5–15.5)
WBC: 6 10*3/uL (ref 4.0–10.5)

## 2014-02-25 LAB — HEPATIC FUNCTION PANEL
ALBUMIN: 4.1 g/dL (ref 3.5–5.2)
ALK PHOS: 55 U/L (ref 39–117)
ALT: 25 U/L (ref 0–53)
AST: 27 U/L (ref 0–37)
BILIRUBIN DIRECT: 0.1 mg/dL (ref 0.0–0.3)
TOTAL PROTEIN: 7.4 g/dL (ref 6.0–8.3)
Total Bilirubin: 0.8 mg/dL (ref 0.2–1.2)

## 2014-02-25 LAB — LIPID PANEL
Cholesterol: 176 mg/dL (ref 0–200)
HDL: 65.4 mg/dL (ref >39.00)
LDL Cholesterol: 100 mg/dL — ABNORMAL HIGH (ref 0–99)
Total CHOL/HDL Ratio: 3
Triglycerides: 55 mg/dL (ref 0.0–149.0)
VLDL: 11 mg/dL (ref 0.0–40.0)

## 2014-02-25 LAB — HEMOGLOBIN A1C: HEMOGLOBIN A1C: 6.4 % (ref 4.6–6.5)

## 2014-02-25 NOTE — Assessment & Plan Note (Signed)
New.  Palpable cord w/ overlying erythema.  + TTP.  Get doppler to r/o DVT.  Pt expressed understanding and is in agreement w/ plan.

## 2014-02-25 NOTE — Assessment & Plan Note (Signed)
New.  Pt's pain is intermittent, not related to eating.  Seems to be more positional w/ increased abdominal pressure.  Get labs to r/o biliary dysfxn, infxn.  Get [place] to assess for possible mass or other underlying issue.  Will follow.

## 2014-02-25 NOTE — Assessment & Plan Note (Signed)
Chronic problem.  Asymptomatic.  Well controlled.  Check labs.  No anticipated med changes.

## 2014-02-25 NOTE — Progress Notes (Signed)
Pre visit review using our clinic review tool, if applicable. No additional management support is needed unless otherwise documented below in the visit note. 

## 2014-02-25 NOTE — Progress Notes (Signed)
   Subjective:    Patient ID: Tony Garza, male    DOB: [birth year], 58 y.o.   MRN: [id number]  HPI R flank pain- pt reports intermittent pain, particularly when driving, 'feels like the top of a softball pushing out right against my ribs'.  sxs started 'a couple of months ago'.  No nausea or vomiting.  Not related to food- more positional.  No dysuria, frequency, urgency.  DM- chronic problem, diet controlled.  Due for labs.  Recent CBG 89.  Exercising regularly.  HTN- chronic problem, on Lisinopril.  Denies CP, SOB, HAs, visual changes  Hyperlipidemia- chronic problem, on Simvastatin,  Denies abd pain, N/V.  Leg pain- L calf, started after exercise on Friday.  Developed 'blisters' yesterday on L lower leg.  Pain is 'deep.  Not just on the skin'.  Initially thought it was shingles.   Review of Systems For ROS see HPI     Objective:   Physical Exam  Vitals reviewed. Constitutional: He is oriented to person, place, and time. He appears well-developed and well-nourished. No distress.  HENT:  Head: Normocephalic and atraumatic.  Eyes: Conjunctivae and EOM are normal. Pupils are equal, round, and reactive to light.  Neck: Normal range of motion. Neck supple. No thyromegaly present.  Cardiovascular: Normal rate, regular rhythm, normal heart sounds and intact distal pulses.   No murmur heard. Pulmonary/Chest: Effort normal and breath sounds normal. No respiratory distress.  Abdominal: Soft. Bowel sounds are normal. He exhibits no distension. There is tenderness (RUQ TTP along lower rib w/ ? palpable liver edge). There is guarding (voluntary). There is no rebound.  Musculoskeletal: He exhibits edema (L posterior calf w/ visible cord and overlying erythema) and tenderness (over L posterior calf w/ palpation).  Lymphadenopathy:    He has no cervical adenopathy.  Neurological: He is alert and oriented to person, place, and time. No cranial nerve deficit.  Skin: Skin is warm and dry.    Psychiatric: He has a normal mood and affect. His behavior is normal.          Assessment & Plan:

## 2014-02-25 NOTE — Assessment & Plan Note (Signed)
Chronic problem, controlled w/ ADA diet and regular exercise.  Check labs.  Start meds prn.

## 2014-02-25 NOTE — Assessment & Plan Note (Addendum)
Chronic problem.  Tolerating statin w/o difficulty but now having intermittent RUQ pain (seemingly unrelated to medication use)  Check labs.  Adjust meds prn

## 2014-02-25 NOTE — Patient Instructions (Signed)
Schedule your complete physical for October We'll notify you of your lab results and make any changes if needed We'll notify you of your imaging results and make any changes/treat accordingly HEAT your leg Alternate tylenol/ibuprofen for leg pain Call with any questions or concerns Hang in there!!!

## 2014-02-26 ENCOUNTER — Ambulatory Visit (HOSPITAL_BASED_OUTPATIENT_CLINIC_OR_DEPARTMENT_OTHER)
Admission: RE | Admit: 2014-02-26 | Discharge: 2014-02-26 | Disposition: A | Discharge status: Home / Self Care | Payer: Managed Care, Other (non HMO) | Source: Ambulatory Visit | Attending: Family Medicine | Admitting: Family Medicine

## 2014-02-26 ENCOUNTER — Ambulatory Visit (HOSPITAL_BASED_OUTPATIENT_CLINIC_OR_DEPARTMENT_OTHER): Payer: Managed Care, Other (non HMO)

## 2014-02-26 DIAGNOSIS — R1011 Right upper quadrant pain: Secondary | ICD-10-CM | POA: Insufficient documentation

## 2014-02-26 DIAGNOSIS — M79662 Pain in left lower leg: Secondary | ICD-10-CM

## 2014-03-10 ENCOUNTER — Other Ambulatory Visit: Payer: Self-pay | Admitting: Family Medicine

## 2014-03-11 NOTE — Telephone Encounter (Signed)
Med filled.  

## 2014-04-07 ENCOUNTER — Other Ambulatory Visit: Payer: Self-pay | Admitting: Family Medicine

## 2014-04-08 NOTE — Telephone Encounter (Signed)
Med filled.  

## 2014-06-02 ENCOUNTER — Encounter: Payer: Self-pay | Admitting: General Practice

## 2014-06-11 ENCOUNTER — Other Ambulatory Visit: Payer: Self-pay | Admitting: Family Medicine

## 2014-06-12 NOTE — Telephone Encounter (Signed)
Med filled.  

## 2014-06-18 ENCOUNTER — Encounter: Payer: Self-pay | Admitting: Family Medicine

## 2014-06-18 ENCOUNTER — Ambulatory Visit (INDEPENDENT_AMBULATORY_CARE_PROVIDER_SITE_OTHER): Payer: Managed Care, Other (non HMO) | Admitting: Family Medicine

## 2014-06-18 VITALS — BP 130/84 | HR 56 | Resp 16 | Wt 235.0 lb

## 2014-06-18 DIAGNOSIS — L309 Dermatitis, unspecified: Secondary | ICD-10-CM

## 2014-06-18 DIAGNOSIS — L259 Unspecified contact dermatitis, unspecified cause: Secondary | ICD-10-CM

## 2014-06-18 DIAGNOSIS — Z23 Encounter for immunization: Secondary | ICD-10-CM

## 2014-06-18 DIAGNOSIS — E119 Type 2 diabetes mellitus without complications: Secondary | ICD-10-CM

## 2014-06-18 DIAGNOSIS — B369 Superficial mycosis, unspecified: Secondary | ICD-10-CM | POA: Insufficient documentation

## 2014-06-18 LAB — BASIC METABOLIC PANEL
BUN: 10 mg/dL (ref 6–23)
CALCIUM: 9.4 mg/dL (ref 8.4–10.5)
CO2: 32 meq/L (ref 19–32)
Chloride: 101 mEq/L (ref 96–112)
Creatinine, Ser: 1.1 mg/dL (ref 0.4–1.5)
GFR: 76.58 mL/min (ref >60.00)
Glucose, Bld: 83 mg/dL (ref 70–99)
POTASSIUM: 4.1 meq/L (ref 3.5–5.1)
SODIUM: 139 meq/L (ref 135–145)

## 2014-06-18 LAB — HEMOGLOBIN A1C: Hgb A1c MFr Bld: 6.3 % (ref 4.6–6.5)

## 2014-06-18 MED ORDER — CLOTRIMAZOLE-BETAMETHASONE 1-0.05 % EX CREA
1.0000 | TOPICAL_CREAM | Freq: Two times a day (BID) | CUTANEOUS | Status: DC
Start: 2014-06-18 — End: 2014-09-11

## 2014-06-18 NOTE — Assessment & Plan Note (Signed)
Chronic problem.  Pt reports that sugars are well controlled but w/ increase in fungal dermatitis symptoms, I worry that his sugars may be higher than previous.  Check labs.  Adjust meds prn

## 2014-06-18 NOTE — Patient Instructions (Signed)
Follow up as scheduled for your physical We'll notify you of your lab results and make any changes if needed Apply the Lotrisone cream twice daily- if no improvement in the next 2 weeks, please call for a derm referral Call with any questions or concerns HAPPY BIRTHDAY!!!

## 2014-06-18 NOTE — Progress Notes (Signed)
Pre visit review using our clinic review tool, if applicable. No additional management support is needed unless otherwise documented below in the visit note. 

## 2014-06-18 NOTE — Assessment & Plan Note (Signed)
New.  Pt w/ obvious fungal dermatitis in L axilla and worsening rash in groin.  Start topical Lotrisone cream.  Pt to D/C Ellidel as it is ineffective and has possible risks.  Pt expressed understanding and is in agreement w/ plan.

## 2014-06-18 NOTE — Progress Notes (Signed)
   Subjective:    Patient ID: Tony Garza, male    DOB: 07/26/1956, 58 y.o.   MRN: [id number]  HPI Rash- R medial thigh.  Started 2-3 months ago, 'started as a funny looking patch', now constant.  Itchy.  Will 'occasionally ooze'.  Using Topical benadryl and Elidel w/o improvement.  L armpit w/ rash.  Hx of jock itch which pt reports is not responding to Elidel and 'worsening'.  Hx of diabetes.  Reports he checks sugars 'about once a month' and that 'they are under 100 every time'.   Review of Systems For ROS see HPI     Objective:   Physical Exam  Vitals reviewed. Constitutional: He is oriented to person, place, and time. He appears well-developed and well-nourished. No distress.  Genitourinary:  Pt declined GU exam  Neurological: He is alert and oriented to person, place, and time.  Skin: Skin is warm and dry. There is erythema (5 cm irregular ovoid area on R medial thigh w/ some excoriation and scaling present.  no fluctuance.  no drainage.  fungal dermatitis present in L axilla w/ reactive LN (mildly TTP)).          Assessment & Plan:

## 2014-06-18 NOTE — Assessment & Plan Note (Signed)
New.  Area on R medial thigh consistent w/ eczematous patch.  Start topical Lotrisone cream to cover both eczema and possible superimposed fungal infxn.  If no improvement w/ topical tx, pt will need derm referral.  Pt expressed understanding and is in agreement w/ plan.

## 2014-06-19 ENCOUNTER — Encounter: Payer: Self-pay | Admitting: General Practice

## 2014-09-07 ENCOUNTER — Other Ambulatory Visit: Payer: Self-pay | Admitting: Family Medicine

## 2014-09-08 NOTE — Telephone Encounter (Signed)
Med filled.  

## 2014-09-11 ENCOUNTER — Ambulatory Visit (INDEPENDENT_AMBULATORY_CARE_PROVIDER_SITE_OTHER): Payer: Managed Care, Other (non HMO) | Admitting: Family Medicine

## 2014-09-11 ENCOUNTER — Encounter: Payer: Self-pay | Admitting: Family Medicine

## 2014-09-11 ENCOUNTER — Ambulatory Visit (HOSPITAL_BASED_OUTPATIENT_CLINIC_OR_DEPARTMENT_OTHER)
Admission: RE | Admit: 2014-09-11 | Discharge: 2014-09-11 | Disposition: A | Discharge status: Home / Self Care | Payer: Managed Care, Other (non HMO) | Source: Ambulatory Visit | Attending: Family Medicine | Admitting: Family Medicine

## 2014-09-11 VITALS — BP 122/82 | HR 50 | Temp 98.2°F | Resp 16 | Ht 72.0 in | Wt 241.0 lb

## 2014-09-11 DIAGNOSIS — M7989 Other specified soft tissue disorders: Secondary | ICD-10-CM | POA: Insufficient documentation

## 2014-09-11 DIAGNOSIS — Z0000 Encounter for general adult medical examination without abnormal findings: Secondary | ICD-10-CM

## 2014-09-11 DIAGNOSIS — M25572 Pain in left ankle and joints of left foot: Secondary | ICD-10-CM

## 2014-09-11 DIAGNOSIS — E119 Type 2 diabetes mellitus without complications: Secondary | ICD-10-CM

## 2014-09-11 LAB — HEPATIC FUNCTION PANEL
ALT: 21 U/L (ref 0–53)
AST: 30 U/L (ref 0–37)
Albumin: 4.1 g/dL (ref 3.5–5.2)
Alkaline Phosphatase: 60 U/L (ref 39–117)
Bilirubin, Direct: 0.2 mg/dL (ref 0.0–0.3)
TOTAL PROTEIN: 6.9 g/dL (ref 6.0–8.3)
Total Bilirubin: 1 mg/dL (ref 0.2–1.2)

## 2014-09-11 LAB — LIPID PANEL
CHOLESTEROL: 194 mg/dL (ref 0–200)
HDL: 62 mg/dL (ref >39.00)
LDL Cholesterol: 121 mg/dL — ABNORMAL HIGH (ref 0–99)
NonHDL: 132
Total CHOL/HDL Ratio: 3
Triglycerides: 53 mg/dL (ref 0.0–149.0)
VLDL: 10.6 mg/dL (ref 0.0–40.0)

## 2014-09-11 LAB — CBC WITH DIFFERENTIAL/PLATELET
BASOS ABS: 0.1 10*3/uL (ref 0.0–0.1)
BASOS PCT: 1 % (ref 0.0–3.0)
EOS ABS: 0.2 10*3/uL (ref 0.0–0.7)
Eosinophils Relative: 3.3 % (ref 0.0–5.0)
HCT: 42 % (ref 39.0–52.0)
Hemoglobin: 14 g/dL (ref 13.0–17.0)
LYMPHS PCT: 35.3 % (ref 12.0–46.0)
Lymphs Abs: 2 10*3/uL (ref 0.7–4.0)
MCHC: 33.4 g/dL (ref 30.0–36.0)
MCV: 93.9 fl (ref 78.0–100.0)
Monocytes Absolute: 0.6 10*3/uL (ref 0.1–1.0)
Monocytes Relative: 10.2 % (ref 3.0–12.0)
NEUTROS PCT: 50.2 % (ref 43.0–77.0)
Neutro Abs: 2.9 10*3/uL (ref 1.4–7.7)
PLATELETS: 250 10*3/uL (ref 150.0–400.0)
RBC: 4.47 Mil/uL (ref 4.22–5.81)
RDW: 13.6 % (ref 11.5–15.5)
WBC: 5.7 10*3/uL (ref 4.0–10.5)

## 2014-09-11 LAB — PSA: PSA: 1.44 ng/mL (ref 0.10–4.00)

## 2014-09-11 LAB — BASIC METABOLIC PANEL
BUN: 16 mg/dL (ref 6–23)
CHLORIDE: 102 meq/L (ref 96–112)
CO2: 27 meq/L (ref 19–32)
Calcium: 8.9 mg/dL (ref 8.4–10.5)
Creatinine, Ser: 1.1 mg/dL (ref 0.4–1.5)
GFR: 75.68 mL/min (ref >60.00)
GLUCOSE: 127 mg/dL — AB (ref 70–99)
Potassium: 4.3 mEq/L (ref 3.5–5.1)
SODIUM: 138 meq/L (ref 135–145)

## 2014-09-11 LAB — HEMOGLOBIN A1C: HEMOGLOBIN A1C: 6.4 % (ref 4.6–6.5)

## 2014-09-11 LAB — TSH: TSH: 1.11 u[IU]/mL (ref 0.35–4.50)

## 2014-09-11 MED ORDER — CLOTRIMAZOLE-BETAMETHASONE 1-0.05 % EX CREA: 1.0000 "application " | TOPICAL_CREAM | Freq: Two times a day (BID) | CUTANEOUS | Status: DC

## 2014-09-11 NOTE — Assessment & Plan Note (Signed)
Chronic problem.  Diet controlled.  On ACE.  UTD on eye exam.  Check labs.  Start meds prn.

## 2014-09-11 NOTE — Assessment & Plan Note (Signed)
New.  No known injury.  Pt w/ good ROM.  Notable swelling over medial malleolus w/ some TTP.  Get xrays to assess.

## 2014-09-11 NOTE — Patient Instructions (Signed)
Follow up in 4 months to recheck diabetes We'll notify you of your lab results and make any changes if needed Keep up the good work on healthy diet and regular exercise Get your xrays on the way out today and we'll see what's going on with the ankle Call with any questions or concerns Happy Holidays!

## 2014-09-11 NOTE — Assessment & Plan Note (Signed)
Pt's PE WNL w/ exception of swelling and tenderness over L medial malleolus.  UTD on colonoscopy, eye exam.  Check labs.  Anticipatory guidance provided.

## 2014-09-11 NOTE — Progress Notes (Signed)
Pre visit review using our clinic review tool, if applicable. No additional management support is needed unless otherwise documented below in the visit note. 

## 2014-09-11 NOTE — Progress Notes (Signed)
   Subjective:    Patient ID: Tony Garza, male    DOB: [birth year], 58 y.o.   MRN: [id number]  HPI CPE- UTD on colonoscopy, eye exam.  L ankle pain- no known injury.  Point tenderness over medial malleolus.  Started 6-8 weeks ago.  Most bothersome in AM.  Able to exercise w/o difficulty.  Review of Systems Patient reports no vision/hearing changes, anorexia, fever ,adenopathy, persistant/recurrent hoarseness, swallowing issues, chest pain, palpitations, edema, persistant/recurrent cough, hemoptysis, dyspnea (rest,exertional, paroxysmal nocturnal), gastrointestinal  bleeding (melena, rectal bleeding), abdominal pain, excessive heart burn, GU symptoms (dysuria, hematuria, voiding/incontinence issues) syncope, focal weakness, memory loss, numbness & tingling, skin/hair/nail changes, depression, anxiety, abnormal bruising/bleeding.     Objective:   Physical Exam BP 122/82 mmHg  Pulse 50  Temp(Src) 98.2 F (36.8 C) (Oral)  Resp 16  Ht 6' (1.829 m)  Wt 241 lb (109.317 kg)  BMI 32.68 kg/m2  SpO2 97%  General Appearance:    Alert, cooperative, no distress, appears stated age  Head:    Normocephalic, without obvious abnormality, atraumatic  Eyes:    PERRL, conjunctiva/corneas clear, EOM's intact, fundi    benign, both eyes       Ears:    Normal TM's and external ear canals, both ears  Nose:   Nares normal, septum midline, mucosa normal, no drainage   or sinus tenderness  Throat:   Lips, mucosa, and tongue normal; teeth and gums normal  Neck:   Supple, symmetrical, trachea midline, no adenopathy;       thyroid:  No enlargement/tenderness/nodules  Back:     Symmetric, no curvature, ROM normal, no CVA tenderness  Lungs:     Clear to auscultation bilaterally, respirations unlabored  Chest wall:    No tenderness or deformity  Heart:    Regular rate and rhythm, S1 and S2 normal, no murmur, rub   or gallop  Abdomen:     Soft, non-tender, bowel sounds active all four quadrants,    no  masses, no organomegaly  Genitalia:    Normal male without lesion, discharge or tenderness  Rectal:    Normal tone, normal prostate, no masses or tenderness  Extremities:   Extremities normal, atraumatic, no cyanosis.  L ankle swelling over medial malleolus  Pulses:   2+ and symmetric all extremities  Skin:   Skin color, texture, turgor normal, no rashes or lesions  Lymph nodes:   Cervical, supraclavicular, and axillary nodes normal  Neurologic:   CNII-XII intact. Normal strength, sensation and reflexes      throughout          Assessment & Plan:

## 2014-09-12 ENCOUNTER — Encounter: Payer: Self-pay | Admitting: General Practice

## 2014-09-25 ENCOUNTER — Encounter: Payer: Managed Care, Other (non HMO) | Admitting: Family Medicine

## 2014-10-01 ENCOUNTER — Encounter: Payer: Self-pay | Admitting: Physician Assistant

## 2014-10-01 ENCOUNTER — Ambulatory Visit (INDEPENDENT_AMBULATORY_CARE_PROVIDER_SITE_OTHER): Payer: Managed Care, Other (non HMO) | Admitting: Physician Assistant

## 2014-10-01 VITALS — BP 121/58 | HR 49 | Temp 98.4°F | Resp 16 | Ht 72.0 in | Wt 239.1 lb

## 2014-10-01 DIAGNOSIS — H65191 Other acute nonsuppurative otitis media, right ear: Secondary | ICD-10-CM

## 2014-10-01 MED ORDER — AZITHROMYCIN 250 MG PO TABS: ORAL_TABLET | ORAL | Status: DC

## 2014-10-01 MED ORDER — FLUTICASONE PROPIONATE 50 MCG/ACT NA SUSP: 2.0000 | Freq: Every day | NASAL | Status: DC

## 2014-10-01 NOTE — Progress Notes (Signed)
Patient presents to clinic today c/o worsening ear pain and pressure x 2 days.  Denies dizziness, tinnitus, drainage or fever.  Endorses some mild decreased hearing of R ear.  Endorses some mild congestion but denies cough.  Past Medical History  Diagnosis Date  . Peyronie disease   . Hyperlipidemia   . Arthritis     osteoarthritis  . GERD (gastroesophageal reflux disease)   . ED (erectile dysfunction)   . Obesity   . Anxiety   . Diabetes mellitus, type 2 06/30/2011  . Unspecified essential hypertension 06/30/2011  . Cancer   . Chicken pox   . Colon polyp     Current Outpatient Prescriptions on File Prior to Visit  Medication Sig Dispense Refill  . aspirin 81 MG tablet Take 81 mg by mouth daily.      . clotrimazole-betamethasone (LOTRISONE) cream Apply 1 application topically 2 (two) times daily. 45 g 1  . DEXILANT 60 MG capsule TAKE ONE CAPSULE BY MOUTH DAILY 30 capsule 3  . glucosamine-chondroitin 500-400 MG tablet Take 1 tablet by mouth daily.      Marland Kitchen lisinopril (PRINIVIL,ZESTRIL) 10 MG tablet TAKE 1 TABLET BY MOUTH DAILY 30 tablet 3  . Multiple Vitamin (MULTIVITAMIN) tablet Take 1 tablet by mouth daily.      . Omega-3 Fatty Acids (FISH OIL) 1200 MG CAPS Take 1 capsule by mouth daily.      . simvastatin (ZOCOR) 40 MG tablet TAKE 1 TABLET BY MOUTH EVERY NIGHT AT BEDTIME 90 tablet 3  . vitamin B-12 (CYANOCOBALAMIN) 1000 MCG tablet Take 1,000 mcg by mouth daily.      . vitamin C (ASCORBIC ACID) 500 MG tablet Take 500 mg by mouth daily.       No current facility-administered medications on file prior to visit.    No Known Allergies  Family History  Problem Relation Age of Onset  . Diabetes Mother   . Cancer Mother   . Cancer Father     lymphoma  . Early death Father     History   Social History  . Marital Status: Married    Spouse Name: N/A    Number of Children: N/A  . Years of Education: N/A   Occupational History  . Service manager    Social History Main  Topics  . Smoking status: Former Smoker -- 1.00 packs/day for 20 years  . Smokeless tobacco: None  . Alcohol Use: Yes  . Drug Use: None  . Sexual Activity: None   Other Topics Concern  . None   Social History Narrative   Regular exercise--no    Review of Systems - See HPI.  All other ROS are negative.  BP 121/58 mmHg  Pulse 49  Temp(Src) 98.4 F (36.9 C) (Oral)  Resp 16  Ht 6' (1.829 m)  Wt 239 lb 2 oz (108.466 kg)  BMI 32.42 kg/m2  SpO2 96%  Physical Exam  Constitutional: He is oriented to person, place, and time and well-developed, well-nourished, and in no distress.  HENT:  Head: Normocephalic and atraumatic.  Right Ear: External ear and ear canal normal. Tympanic membrane is erythematous and bulging. A middle ear effusion is present.  Left Ear: External ear and ear canal normal.  Nose: Nose normal.  Mouth/Throat: Uvula is midline and mucous membranes are normal.  Eyes: Conjunctivae are normal. Pupils are equal, round, and reactive to light.  Neck: Neck supple.  Cardiovascular: Normal rate, regular rhythm, normal heart sounds and intact distal pulses.  Pulmonary/Chest: Effort normal and breath sounds normal. No respiratory distress. He has no wheezes. He has no rales. He exhibits no tenderness.  Lymphadenopathy:    He has no cervical adenopathy.  Neurological: He is alert and oriented to person, place, and time.  Skin: Skin is warm and dry. No rash noted.  Psychiatric: Affect normal.  Vitals reviewed.   Recent Results (from the past 2160 hour(s))  Hemoglobin A1c     Status: None   Collection Time: 09/11/14  8:42 AM  Result Value Ref Range   Hgb A1c MFr Bld 6.4 4.6 - 6.5 %    Comment: Glycemic Control Guidelines for People with Diabetes:Non Diabetic:  <6%Goal of Therapy: <7%Additional Action Suggested:  >8%   Lipid panel     Status: Abnormal   Collection Time: 09/11/14  8:42 AM  Result Value Ref Range   Cholesterol 194 0 - 200 mg/dL    Comment: ATP III  Classification       Desirable:  < 200 mg/dL               Borderline High:  200 - 239 mg/dL          High:  > = 240 mg/dL   Triglycerides 53.0 0.0 - 149.0 mg/dL    Comment: Normal:  <150 mg/dLBorderline High:  150 - 199 mg/dL   HDL 62.00 >39.00 mg/dL   VLDL 10.6 0.0 - 40.0 mg/dL   LDL Cholesterol 121 (H) 0 - 99 mg/dL   Total CHOL/HDL Ratio 3     Comment:                Men          Women1/2 Average Risk     3.4          3.3Average Risk          5.0          4.42X Average Risk          9.6          7.13X Average Risk          15.0          11.0                       NonHDL 132.00     Comment: NOTE:  Non-HDL goal should be 30 mg/dL higher than patient's LDL goal (i.e. LDL goal of < 70 mg/dL, would have non-HDL goal of < 100 mg/dL)  Basic metabolic panel     Status: Abnormal   Collection Time: 09/11/14  8:42 AM  Result Value Ref Range   Sodium 138 135 - 145 mEq/L   Potassium 4.3 3.5 - 5.1 mEq/L   Chloride 102 96 - 112 mEq/L   CO2 27 19 - 32 mEq/L   Glucose, Bld 127 (H) 70 - 99 mg/dL   BUN 16 6 - 23 mg/dL   Creatinine, Ser 1.1 0.4 - 1.5 mg/dL   Calcium 8.9 8.4 - 10.5 mg/dL   GFR 75.68 >60.00 mL/min  TSH     Status: None   Collection Time: 09/11/14  8:42 AM  Result Value Ref Range   TSH 1.11 0.35 - 4.50 uIU/mL  Hepatic function panel     Status: None   Collection Time: 09/11/14  8:42 AM  Result Value Ref Range   Total Bilirubin 1.0 0.2 - 1.2 mg/dL   Bilirubin, Direct 0.2 0.0 - 0.3 mg/dL  Alkaline Phosphatase 60 39 - 117 U/L   AST 30 0 - 37 U/L   ALT 21 0 - 53 U/L   Total Protein 6.9 6.0 - 8.3 g/dL   Albumin 4.1 3.5 - 5.2 g/dL  CBC with Differential     Status: None   Collection Time: 09/11/14  8:42 AM  Result Value Ref Range   WBC 5.7 4.0 - 10.5 K/uL   RBC 4.47 4.22 - 5.81 Mil/uL   Hemoglobin 14.0 13.0 - 17.0 g/dL   HCT 42.0 39.0 - 52.0 %   MCV 93.9 78.0 - 100.0 fl   MCHC 33.4 30.0 - 36.0 g/dL   RDW 13.6 11.5 - 15.5 %   Platelets 250.0 150.0 - 400.0 K/uL   Neutrophils  Relative % 50.2 43.0 - 77.0 %   Lymphocytes Relative 35.3 12.0 - 46.0 %   Monocytes Relative 10.2 3.0 - 12.0 %   Eosinophils Relative 3.3 0.0 - 5.0 %   Basophils Relative 1.0 0.0 - 3.0 %   Neutro Abs 2.9 1.4 - 7.7 K/uL   Lymphs Abs 2.0 0.7 - 4.0 K/uL   Monocytes Absolute 0.6 0.1 - 1.0 K/uL   Eosinophils Absolute 0.2 0.0 - 0.7 K/uL   Basophils Absolute 0.1 0.0 - 0.1 K/uL  PSA     Status: None   Collection Time: 09/11/14  8:42 AM  Result Value Ref Range   PSA 1.44 0.10 - 4.00 ng/mL    Assessment/Plan: OM (otitis media) Rx Azithromycin.  Rx Flonase to use daily.  Supportive measures discussed with patient.  Follow-up PRN if symptoms are not improving.

## 2014-10-01 NOTE — Patient Instructions (Signed)
Please take antibiotic and use Flonase daily as directed.  You can also consider an antihistamine like Claritin to help with fluid buildup.  Call or return to clinic if symptoms are not improving.  Otitis Media Otitis media is redness, soreness, and inflammation of the middle ear. Otitis media may be caused by allergies or, most commonly, by infection. Often it occurs as a complication of the common cold. SIGNS AND SYMPTOMS Symptoms of otitis media may include:  Earache.  Fever.  Ringing in your ear.  Headache.  Leakage of fluid from the ear. DIAGNOSIS To diagnose otitis media, your health care provider will examine your ear with an otoscope. This is an instrument that allows your health care provider to see into your ear in order to examine your eardrum. Your health care provider also will ask you questions about your symptoms. TREATMENT  Typically, otitis media resolves on its own within 3-5 days. Your health care provider may prescribe medicine to ease your symptoms of pain. If otitis media does not resolve within 5 days or is recurrent, your health care provider may prescribe antibiotic medicines if he or she suspects that a bacterial infection is the cause. HOME CARE INSTRUCTIONS   If you were prescribed an antibiotic medicine, finish it all even if you start to feel better.  Take medicines only as directed by your health care provider.  Keep all follow-up visits as directed by your health care provider. SEEK MEDICAL CARE IF:  You have otitis media only in one ear, or bleeding from your nose, or both.  You notice a lump on your neck.  You are not getting better in 3-5 days.  You feel worse instead of better. SEEK IMMEDIATE MEDICAL CARE IF:   You have pain that is not controlled with medicine.  You have swelling, redness, or pain around your ear or stiffness in your neck.  You notice that part of your face is paralyzed.  You notice that the bone behind your ear  (mastoid) is tender when you touch it. MAKE SURE YOU:   Understand these instructions.  Will watch your condition.  Will get help right away if you are not doing well or get worse. Document Released: 07/01/2004 Document Revised: 02/10/2014 Document Reviewed: 04/23/2013 [facility] Patient Information 2015 [place], [place]. This information is not intended to replace advice given to you by your health care provider. Make sure you discuss any questions you have with your health care provider.

## 2014-10-01 NOTE — Progress Notes (Signed)
Pre visit review using our clinic review tool, if applicable. No additional management support is needed unless otherwise documented below in the visit note/SLS  

## 2014-10-01 NOTE — Assessment & Plan Note (Signed)
Rx Azithromycin.  Rx Flonase to use daily.  Supportive measures discussed with patient.  Follow-up PRN if symptoms are not improving.

## 2014-11-04 ENCOUNTER — Telehealth: Payer: Self-pay | Admitting: Family Medicine

## 2014-11-04 DIAGNOSIS — M25572 Pain in left ankle and joints of left foot: Secondary | ICD-10-CM

## 2014-11-04 NOTE — Telephone Encounter (Signed)
Pt seen 09/11/14 for a CPE, noted then that he was having ankle pain. Please advise if ok for ortho.

## 2014-11-04 NOTE — Telephone Encounter (Signed)
Caller name: Gail  Relation to pt: self Call back number: [phone] Pharmacy:  Reason for call:   Patient states that he is still having ankle pain from last visit. He would like to be referred out to a specialist.

## 2014-11-05 NOTE — Telephone Encounter (Signed)
Ward for Ortho referral

## 2014-11-05 NOTE — Telephone Encounter (Signed)
Referral placed.

## 2014-11-09 ENCOUNTER — Other Ambulatory Visit: Payer: Self-pay | Admitting: Family Medicine

## 2014-11-10 NOTE — Telephone Encounter (Signed)
Med filled.  

## 2014-11-11 ENCOUNTER — Ambulatory Visit: Payer: Managed Care, Other (non HMO) | Attending: Sports Medicine

## 2014-11-11 DIAGNOSIS — M2142 Flat foot [pes planus] (acquired), left foot: Secondary | ICD-10-CM | POA: Insufficient documentation

## 2014-11-11 DIAGNOSIS — M25572 Pain in left ankle and joints of left foot: Secondary | ICD-10-CM | POA: Diagnosis not present

## 2014-11-13 ENCOUNTER — Ambulatory Visit: Payer: Managed Care, Other (non HMO) | Admitting: Rehabilitation

## 2014-11-13 DIAGNOSIS — M25572 Pain in left ankle and joints of left foot: Secondary | ICD-10-CM | POA: Diagnosis not present

## 2014-11-20 ENCOUNTER — Ambulatory Visit: Payer: Managed Care, Other (non HMO) | Admitting: Rehabilitation

## 2014-11-20 DIAGNOSIS — M25572 Pain in left ankle and joints of left foot: Secondary | ICD-10-CM | POA: Diagnosis not present

## 2014-11-26 ENCOUNTER — Encounter: Payer: Self-pay | Admitting: Rehabilitation

## 2014-11-26 ENCOUNTER — Ambulatory Visit: Payer: Managed Care, Other (non HMO) | Admitting: Rehabilitation

## 2014-11-26 DIAGNOSIS — M25572 Pain in left ankle and joints of left foot: Secondary | ICD-10-CM | POA: Diagnosis not present

## 2014-11-26 NOTE — Therapy (Signed)
[facility] [address]  [place], [place], [address] Phone: [phone]   Fax:  [phone]  Physical Therapy Treatment  Patient Details  Name: Tony Garza MRN: [id number] Date of Birth: 02/09/1956 Referring Provider:  Pedro Earls, MD  Encounter Date: 11/26/2014      PT End of Session - 11/26/14 0844    Visit Number 4   Number of Visits 8   Date for PT Re-Evaluation 12/12/14   PT Start Time 0802   PT Stop Time 0840   PT Time Calculation (min) 38 min   Activity Tolerance Patient tolerated treatment well   Behavior During Therapy [facility] for tasks assessed/performed      Past Medical History  Diagnosis Date  . Peyronie disease   . Hyperlipidemia   . Arthritis     osteoarthritis  . GERD (gastroesophageal reflux disease)   . ED (erectile dysfunction)   . Obesity   . Anxiety   . Diabetes mellitus, type 2 06/30/2011  . Unspecified essential hypertension 06/30/2011  . Cancer   . Chicken pox   . Colon polyp     Past Surgical History  Procedure Laterality Date  . Vasectomy    . Tonsillectomy  1960  . Skin tag removal      various removal from skin    There were no vitals taken for this visit.  Visit Diagnosis:  Left ankle pain      Subjective Assessment - 11/26/14 0809    Symptoms Left ankle pain   Limitations Standing   Currently in Pain? Yes   Pain Score 4    Pain Location Ankle   Pain Orientation Left   Pain Type Chronic pain   Pain Onset More than a month ago   Pain Frequency Intermittent   Multiple Pain Sites No          [facility] PT Assessment - 11/26/14 0849    Observation/Other Assessments   Focus on Therapeutic Outcomes (FOTO)  FOTO= 50% limited   AROM   Left Ankle Dorsiflexion 16   Left Ankle Plantar Flexion 45   Left Ankle Inversion 38   Left Ankle Eversion 20   Strength   Left Ankle Dorsiflexion 5/5  seated   Left Ankle Plantar Flexion 4/5  seated   Left Ankle  Inversion 4-/5  Pain, seated   Left Ankle Eversion 4+/5  seated                  [facility] Adult PT Treatment/Exercise - 11/26/14 0001    Exercises   Exercises Ankle   Ankle Exercises: Stretches   Soleus Stretch 3 reps;30 seconds   Other Stretch DF Rocker 3x30 seconds    Ankle Exercises: Aerobic   Stationary Bike level 2 x 6 minutes   Ankle Exercises: Standing   Heel Raises 10 reps  Eccentric heelraises on left   Other Standing Ankle Exercises BOSU SLS 5" hold x 10  Focus on flat food/preventing fall in   Other Standing Ankle Exercises SLS on blue foam, pen/coin 3 seconds x 10   Ankle Exercises: Seated   Other Seated Ankle Exercises Pen/Coin  5 second hold x 10   Ankle Exercises: Supine   T-Band blue TB x15  PF, IV, EV                     PT Long Term Goals - 11/26/14 7017    PT LONG TERM GOAL #1  Title Demonstrate and/or verbalize technique to reduce risk of re-injury to include info on physcial activity   Time 4   Period Weeks   Status New   PT LONG TERM GOAL #2   Title be independent with advanced HEP for strength and balance   Time 4   Period Weeks   Status New   PT LONG TERM GOAL #3   Title report pain decrease to no more than 4/10 with daily activities   Time 4   Period Weeks   Status New   PT LONG TERM GOAL #4   Title walk with normalized pattern with regular shoes   Time 4   Status New               Plan - 11/26/14 0454    Clinical Impression Statement Patient tolerated treatment well noting slight pain with standing pen/coin activity. Patient is unsure if PT is helping, will have PT assess at next visit.    Pt will benefit from skilled therapeutic intervention in order to improve on the following deficits Decreased balance;Difficulty walking;Decreased strength;Decreased range of motion   Rehab Potential Good   PT Frequency 2x / week   PT Duration 4 weeks   PT Treatment/Interventions Moist Heat;Manual  techniques;Cryotherapy;Ultrasound;Electrical Stimulation;Patient/family education;Therapeutic exercise;Gait training;Therapeutic activities;Balance training   PT Next Visit Plan Assess current progress. Patient reports no real changes notes since starting therapy   PT Home Exercise Plan Continue with current HEP including pen/coin, gastroc/soleus stretch, current gym activities   Consulted and Agree with Plan of Care Patient        Problem List Patient Active Problem List   Diagnosis Date Noted  . Left ankle pain 09/11/2014  . Eczema 06/18/2014  . Fungal dermatitis 06/18/2014  . RUQ pain 02/25/2014  . Pain of left calf 02/25/2014  . Bronchitis, acute 09/20/2012  . OM (otitis media) 09/20/2012  . Routine general medical examination at a health care facility 07/16/2012  . Neoplasm of uncertain behavior of skin 03/01/2012  . Diabetes mellitus, type 2 06/30/2011  . Unspecified essential hypertension 06/30/2011  . OTHER ANXIETY STATES 06/17/2010  . OTHER MALAISE AND FATIGUE 11/16/2009  . OBESITY 09/07/2009  . GERD 11/03/2008  . HYPERLIPIDEMIA 07/28/2008  . OSTEOARTHRITIS 07/28/2008  . IMPOTENCE 07/25/2008  . ALCOHOL USE 07/25/2008  . PEYRONIE'S DISEASE, HX OF 07/25/2008    Barbette Hair  PTA 11/26/2014, 9:02 AM  [facility] [place], [place], [address] Phone: [phone]   Fax:  [phone]

## 2014-12-03 ENCOUNTER — Ambulatory Visit: Payer: Managed Care, Other (non HMO)

## 2014-12-03 DIAGNOSIS — M25572 Pain in left ankle and joints of left foot: Secondary | ICD-10-CM | POA: Diagnosis not present

## 2014-12-03 NOTE — Therapy (Signed)
[facility] [address]  [place], [place], [address] Phone: [phone]   Fax:  [phone]  Physical Therapy Treatment  Patient Details  Name: Tony Garza MRN: [id number] Date of Birth: 1956-07-27 Referring Provider:  Pedro Earls, MD  Encounter Date: 12/03/2014      PT End of Session - 12/03/14 1438    Visit Number 5   Number of Visits 8   Date for PT Re-Evaluation 12/12/14   PT Start Time 1350   PT Stop Time 1430   PT Time Calculation (min) 40 min   Activity Tolerance Patient tolerated treatment well   Behavior During Therapy [facility] for tasks assessed/performed      Past Medical History  Diagnosis Date  . Peyronie disease   . Hyperlipidemia   . Arthritis     osteoarthritis  . GERD (gastroesophageal reflux disease)   . ED (erectile dysfunction)   . Obesity   . Anxiety   . Diabetes mellitus, type 2 06/30/2011  . Unspecified essential hypertension 06/30/2011  . Cancer   . Chicken pox   . Colon polyp     Past Surgical History  Procedure Laterality Date  . Vasectomy    . Tonsillectomy  1960  . Skin tag removal      various removal from skin    There were no vitals taken for this visit.  Visit Diagnosis:  Left ankle pain      Subjective Assessment - 12/03/14 1352    Symptoms Seems a little better.  Still large knot on side of my foot.  See MD 3/13 or so.  Using ice every night.   Currently in Pain? Yes   Pain Score 4    Pain Location Ankle   Pain Orientation Left   Pain Descriptors / Indicators Nagging   Pain Type Chronic pain   Pain Onset More than a month ago   Pain Frequency Intermittent   Aggravating Factors  walking after sitting awhile    Pain Relieving Factors sitting or after walking awhile                    [facility] Adult PT Treatment/Exercise - 12/03/14 0001    Manual Therapy   Manual Therapy Passive ROM;Myofascial release;Other (comment)   Myofascial  Release with roller stick along plantar fascia 30 reps x 3   Passive ROM stretch to fascia   Other Manual Therapy kinesiotape to left medial arch I strip distal to proximal over area of edema for joint support and edema mgmt   Ankle Exercises: Aerobic   Stationary Bike level 2 x 6 minutes   Ankle Exercises: Stretches   Plantar Fascia Stretch 3 reps;30 seconds  passive   Soleus Stretch 2 reps;20 seconds  knees bent with toes on edge of track for arm bike   Gastroc Stretch 2 reps;20 seconds  toes on edge of track for arm bike   Other Stretch DF rocker 5x10 sec with UE support   Ankle Exercises: Standing   Other Standing Ankle Exercises BOSU SLS 30 sec hold x 3 one hand on pole   Ankle Exercises: Seated   Towel Crunch 5 reps   Marble Pickup 10 x 3 sets                     PT Long Term Goals - 12/03/14 1440    PT LONG TERM GOAL #1   Title Demonstrate and/or verbalize technique  to reduce risk of re-injury to include info on physcial activity   Status On-going   PT LONG TERM GOAL #2   Title be independent with advanced HEP for strength and balance   Status On-going   PT LONG TERM GOAL #3   Title report pain decrease to no more than 4/10 with daily activities   Status On-going   PT LONG TERM GOAL #4   Title walk with normalized pattern with regular shoes   Status On-going               Plan - 12/03/14 1438    Clinical Impression Statement Patient reports at least strength is better with unilateral heel raise on the left and has been doing some of the balance on BOSU at gym as well as modifying normal gym workouts to do single limb.  Feel may have progress with arch strengthening and taping as well as fascial stretch due ot symptoms indicative of soft tissue tighntess.   PT Next Visit Plan Continue per POC with focus on intrinsic foot strengthening and support to arch as well as continued strengthening for ankle stabilizers.   Consulted and Agree with Plan of Care  Patient        Problem List Patient Active Problem List   Diagnosis Date Noted  . Left ankle pain 09/11/2014  . Eczema 06/18/2014  . Fungal dermatitis 06/18/2014  . RUQ pain 02/25/2014  . Pain of left calf 02/25/2014  . Bronchitis, acute 09/20/2012  . OM (otitis media) 09/20/2012  . Routine general medical examination at a health care facility 07/16/2012  . Neoplasm of uncertain behavior of skin 03/01/2012  . Diabetes mellitus, type 2 06/30/2011  . Unspecified essential hypertension 06/30/2011  . OTHER ANXIETY STATES 06/17/2010  . OTHER MALAISE AND FATIGUE 11/16/2009  . OBESITY 09/07/2009  . GERD 11/03/2008  . HYPERLIPIDEMIA 07/28/2008  . OSTEOARTHRITIS 07/28/2008  . IMPOTENCE 07/25/2008  . ALCOHOL USE 07/25/2008  . PEYRONIE'S DISEASE, HX OF 07/25/2008    Nicholis Stepanek,CYNDI 12/03/2014, 2:42 PM  Magda Kiel, PT  [facility] [place], [place], [address] Phone: [phone]   Fax:  [phone]

## 2015-01-08 ENCOUNTER — Other Ambulatory Visit: Payer: Self-pay | Admitting: Family Medicine

## 2015-01-08 NOTE — Telephone Encounter (Signed)
Med filled.  

## 2015-01-12 ENCOUNTER — Encounter: Payer: Self-pay | Admitting: Family Medicine

## 2015-01-12 ENCOUNTER — Ambulatory Visit (INDEPENDENT_AMBULATORY_CARE_PROVIDER_SITE_OTHER): Payer: Managed Care, Other (non HMO) | Admitting: Family Medicine

## 2015-01-12 VITALS — BP 120/76 | HR 54 | Temp 98.1°F | Resp 16 | Wt 239.2 lb

## 2015-01-12 DIAGNOSIS — N521 Erectile dysfunction due to diseases classified elsewhere: Secondary | ICD-10-CM

## 2015-01-12 DIAGNOSIS — E119 Type 2 diabetes mellitus without complications: Secondary | ICD-10-CM | POA: Diagnosis not present

## 2015-01-12 DIAGNOSIS — E1169 Type 2 diabetes mellitus with other specified complication: Secondary | ICD-10-CM

## 2015-01-12 DIAGNOSIS — Z23 Encounter for immunization: Secondary | ICD-10-CM

## 2015-01-12 LAB — BASIC METABOLIC PANEL
BUN: 17 mg/dL (ref 6–23)
CALCIUM: 9.6 mg/dL (ref 8.4–10.5)
CO2: 31 meq/L (ref 19–32)
Chloride: 102 mEq/L (ref 96–112)
Creatinine, Ser: 1.02 mg/dL (ref 0.40–1.50)
GFR: 79.03 mL/min (ref >60.00)
Glucose, Bld: 88 mg/dL (ref 70–99)
POTASSIUM: 4.2 meq/L (ref 3.5–5.1)
SODIUM: 138 meq/L (ref 135–145)

## 2015-01-12 LAB — HEMOGLOBIN A1C: Hgb A1c MFr Bld: 6.3 % (ref 4.6–6.5)

## 2015-01-12 MED ORDER — SILDENAFIL CITRATE 100 MG PO TABS: 50.0000 mg | ORAL_TABLET | Freq: Every day | ORAL | Status: DC | PRN

## 2015-01-12 NOTE — Progress Notes (Signed)
Pre visit review using our clinic review tool, if applicable. No additional management support is needed unless otherwise documented below in the visit note. 

## 2015-01-12 NOTE — Progress Notes (Signed)
   Subjective:    Patient ID: Tony Garza, male    DOB: May 26, 1956, 59 y.o.   MRN: [id number]  HPI DM- chronic problem, diet controlled.  On ACE for renal protection.  UTD on foot exam.  Needs to schedule eye exam (pt has MD but needs to schedule appt).  No CP, SOB, HAs, visual changes, abd pain, N/V, edema, numbness/tingling.  Exercising daily at the gym.  ED- pt reports he is able to have an erection but it is not as firm nor for as long as it used to be.  Wife is interested in him trying Viagra.  Review of Systems For ROS see HPI     Objective:   Physical Exam  Constitutional: He is oriented to person, place, and time. He appears well-developed and well-nourished. No distress.  HENT:  Head: Normocephalic and atraumatic.  Eyes: Conjunctivae and EOM are normal. Pupils are equal, round, and reactive to light.  Neck: Normal range of motion. Neck supple. No thyromegaly present.  Cardiovascular: Normal rate, regular rhythm, normal heart sounds and intact distal pulses.   No murmur heard. Pulmonary/Chest: Effort normal and breath sounds normal. No respiratory distress.  Abdominal: Soft. Bowel sounds are normal. He exhibits no distension.  Musculoskeletal: He exhibits no edema.  Lymphadenopathy:    He has no cervical adenopathy.  Neurological: He is alert and oriented to person, place, and time. No cranial nerve deficit.  Skin: Skin is warm and dry.  Psychiatric: He has a normal mood and affect. His behavior is normal.  Vitals reviewed.         Assessment & Plan:

## 2015-01-12 NOTE — Addendum Note (Signed)
Addended by: Kris Hartmann on: 01/12/2015 09:12 AM   Modules accepted: Orders

## 2015-01-12 NOTE — Assessment & Plan Note (Signed)
New.  Based on pt's sxs will start Viagra and monitor for symptom improvement.  Pt expressed understanding and is in agreement w/ plan.

## 2015-01-12 NOTE — Patient Instructions (Signed)
Follow up in 3-4 months to recheck DM, cholesterol We'll notify you of your lab results and make any changes if needed Start the Viagra 1/2 tab as needed Keep up the good work!  You look great! Call with any questions or concerns Happy Spring!!!

## 2015-01-12 NOTE — Assessment & Plan Note (Signed)
Chronic problem.  Currently diet and exercise controlled.  On ACE for renal protection.  Due for eye exam- plans to schedule.  Check labs.  Start meds prn.  Will follow.

## 2015-01-13 ENCOUNTER — Encounter: Payer: Self-pay | Admitting: General Practice

## 2015-02-06 ENCOUNTER — Encounter: Payer: Self-pay | Admitting: Family Medicine

## 2015-02-06 ENCOUNTER — Encounter: Payer: Self-pay | Admitting: General Practice

## 2015-02-06 ENCOUNTER — Ambulatory Visit (INDEPENDENT_AMBULATORY_CARE_PROVIDER_SITE_OTHER): Payer: Managed Care, Other (non HMO) | Admitting: Family Medicine

## 2015-02-06 VITALS — BP 124/70 | HR 60 | Temp 97.9°F | Resp 16 | Wt 240.1 lb

## 2015-02-06 DIAGNOSIS — L918 Other hypertrophic disorders of the skin: Secondary | ICD-10-CM

## 2015-02-06 NOTE — Progress Notes (Signed)
   Subjective:    Patient ID: Tony Garza, male    DOB: 1955/10/16, 59 y.o.   MRN: [id number]  HPI Pt here today for skin tag removal on R upper medial arm.   Review of Systems For ROS see HPI     Objective:   Physical Exam  Constitutional: He is oriented to person, place, and time. He appears well-developed and well-nourished. No distress.  Neurological: He is alert and oriented to person, place, and time.  Skin: Skin is warm and dry. No erythema.  0.5 cm skin tag on R upper medial arm w/ narrow stalk.  Area prepped w/ alcohol and betadine.  Cold spray applied.  Tag removed w/ scissors.  Silver nitrate applied- hemostasis achieved.  Wound dressed w/ abx ointment and bandaid.  Pt tolerated procedure w/o difficulty.  Vitals reviewed.         Assessment & Plan:

## 2015-02-06 NOTE — Progress Notes (Signed)
Pre visit review using our clinic review tool, if applicable. No additional management support is needed unless otherwise documented below in the visit note. 

## 2015-02-06 NOTE — Assessment & Plan Note (Signed)
Pt here today for skin tag removal.  Consent signed.  Pt tolerated procedure w/o difficulty.  Pt opted to not send skin tag to pathology.

## 2015-02-23 LAB — HM DIABETES EYE EXAM

## 2015-03-03 IMAGING — US US ABDOMEN COMPLETE
1 series · 14 of 25 positions shown · non-contrast
Comparison: None.

CLINICAL DATA: Right upper quadrant pain.

EXAM:
ULTRASOUND ABDOMEN COMPLETE

[Series 1: us abdomen complete · 0.35mm/px · 14 of 68 slices shown]
[im 1/68]
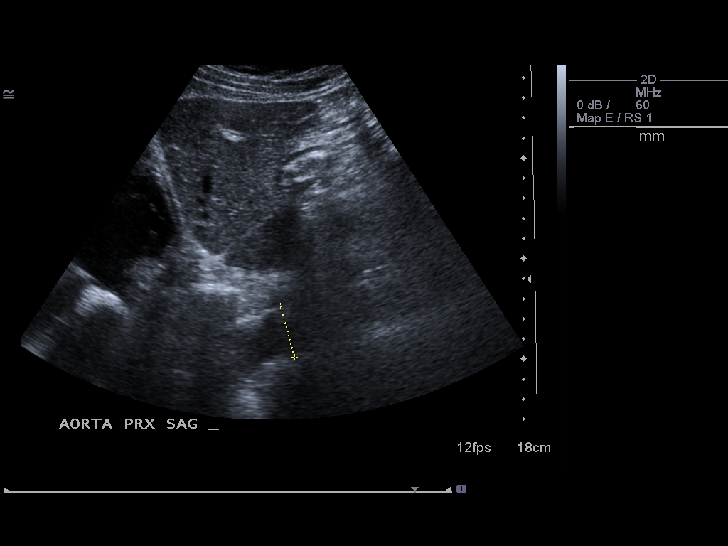
[im 6/68]
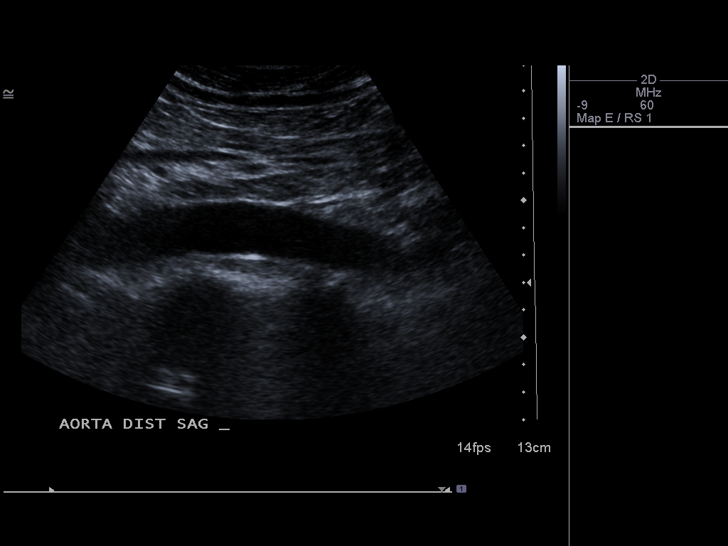
[im 12/68]
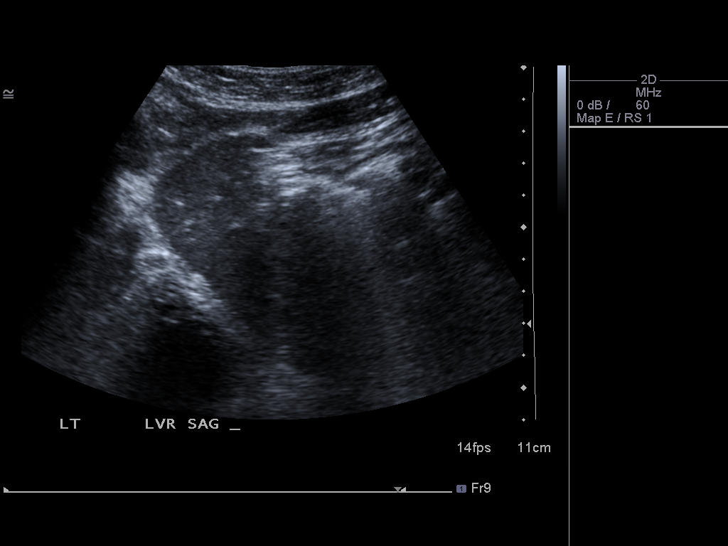
[im 17/68]
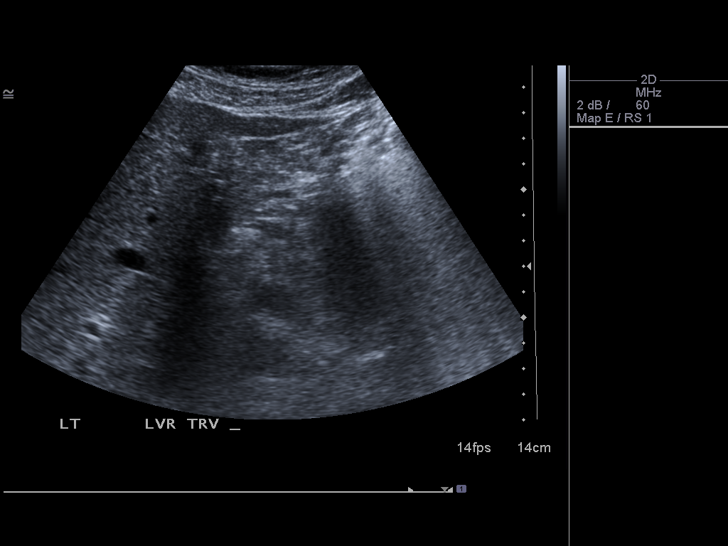
[im 23/68]
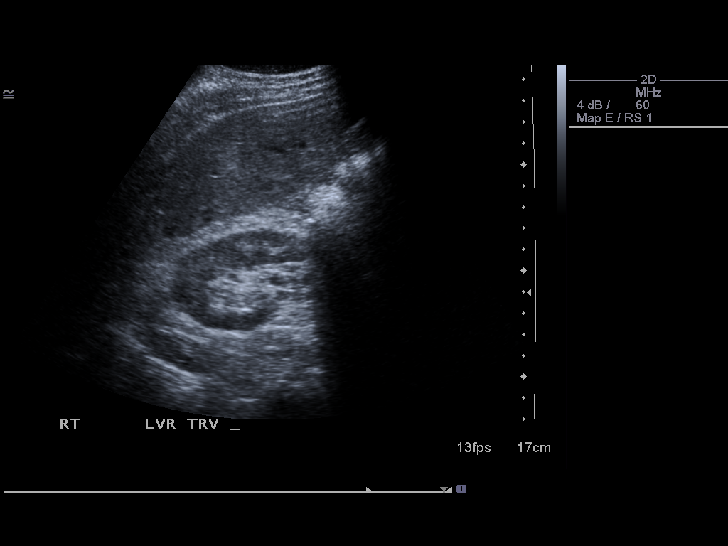
[im 26/68]
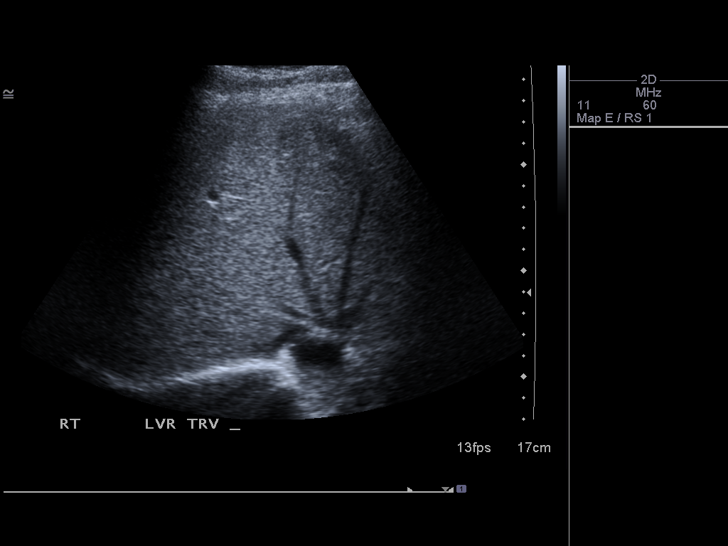
[im 31/68]
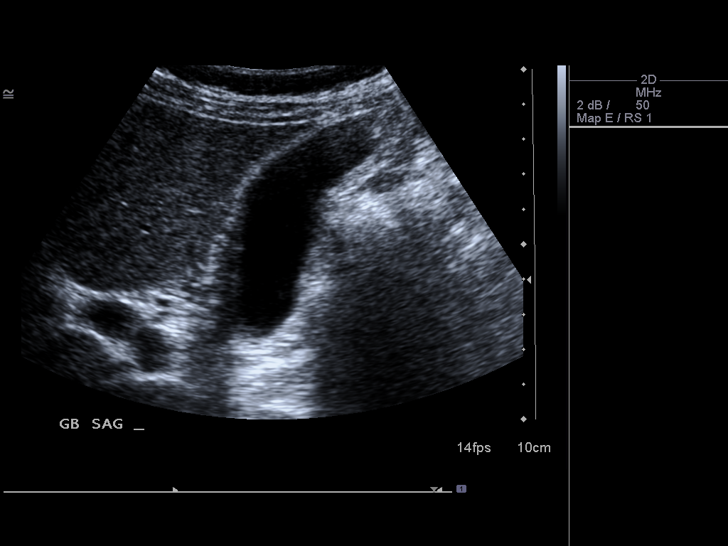
[im 37/68]
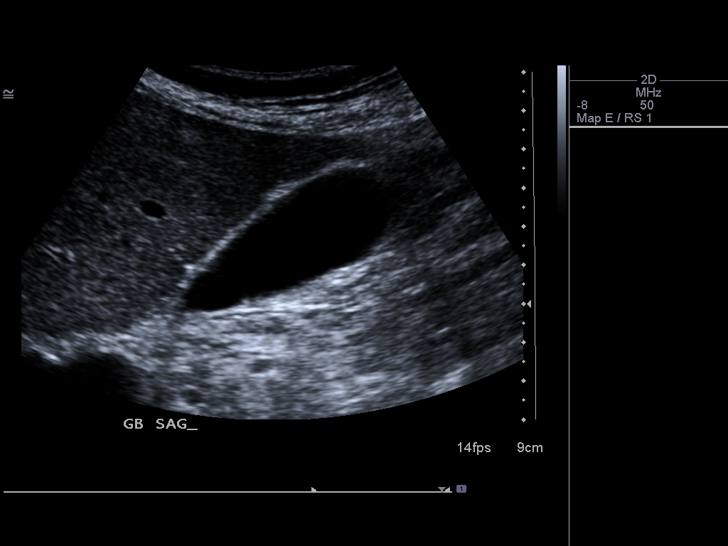
[im 42/68]
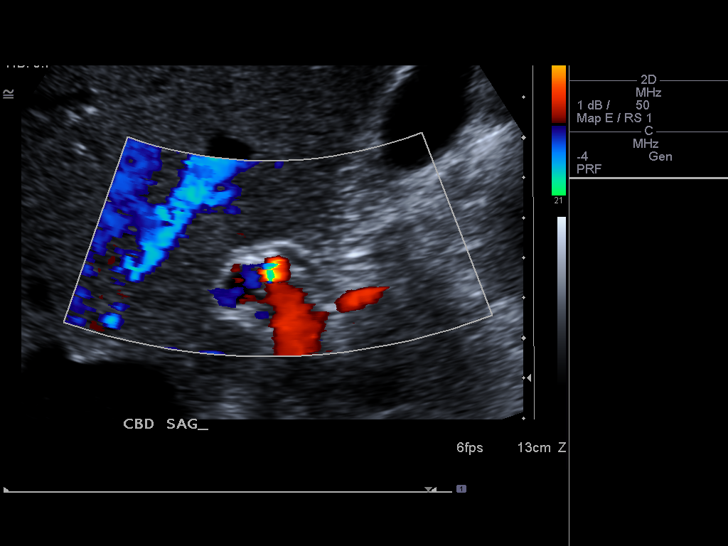
[im 45/68]
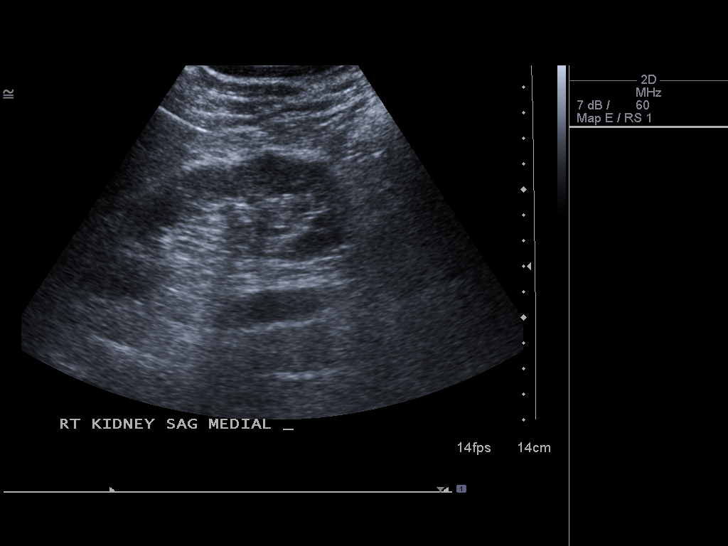
[im 51/68]
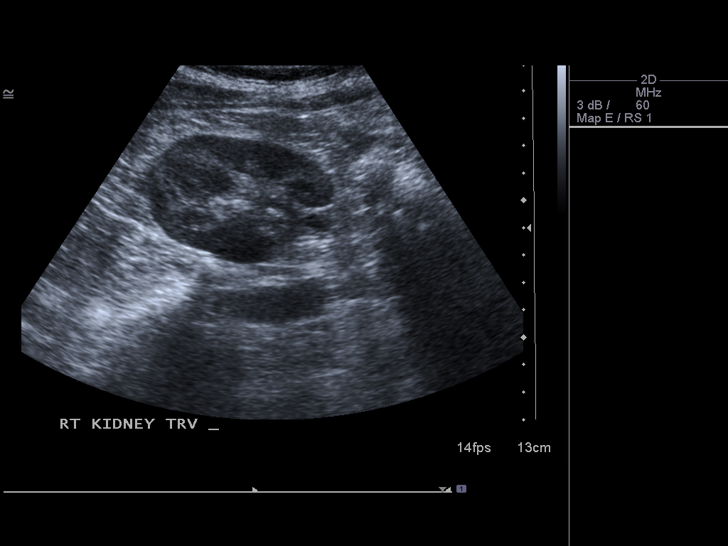
[im 56/68]
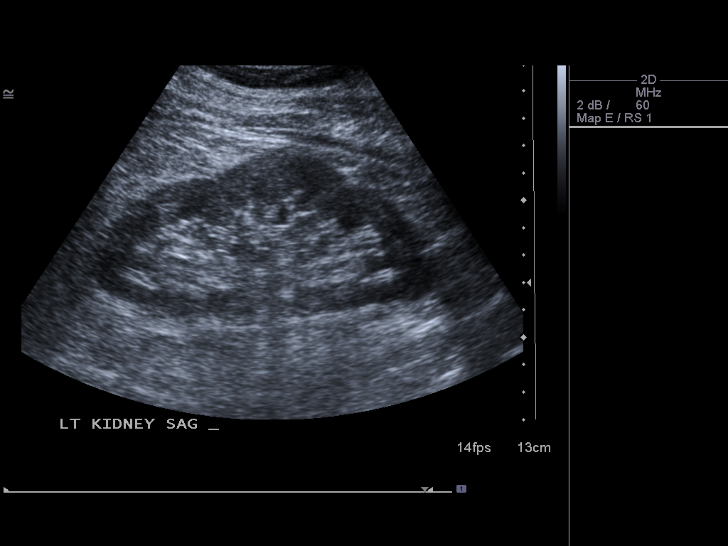
[im 62/68]
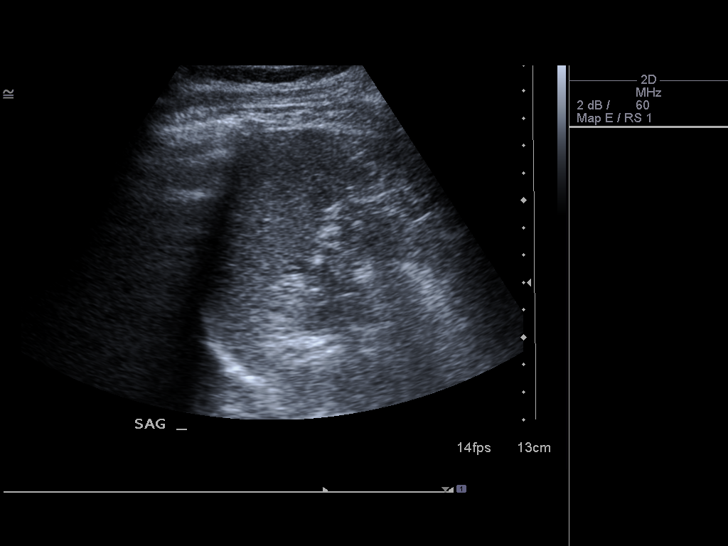
[im 68/68]
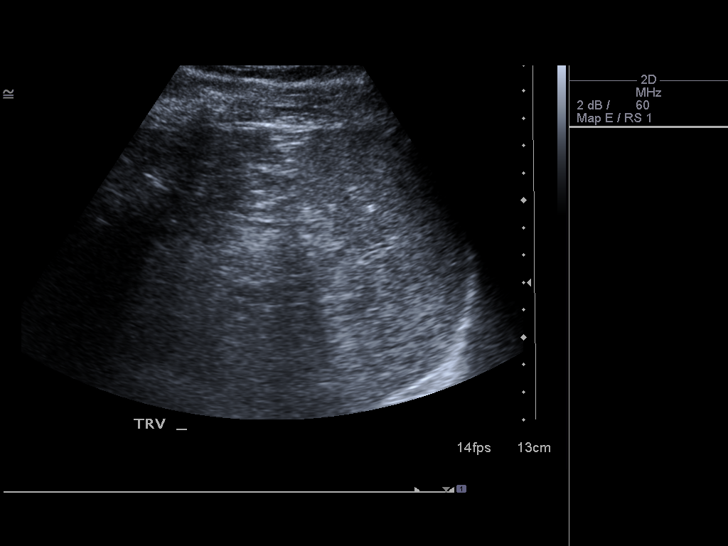

[14 of 25 positions shown; findings below may reference images not displayed]

FINDINGS: Gallbladder:

No gallstones or wall thickening visualized. No sonographic Murphy
sign noted.

Common bile duct:

Diameter: 3 mm

Liver:

No focal lesion identified. Within normal limits in parenchymal
echogenicity.

IVC:

Limited evaluation.

Pancreas:

Limited evaluation due to bowel gas.

Spleen:

Unremarkable in size. Multiple small echogenic foci, nonspecific,
however may represent sequelae of prior granulomatous disease.

Right Kidney:

Length: 12.5 cm. Echogenicity within normal limits. No mass or
hydronephrosis visualized.

Left Kidney:

Length: 12.3 cm. Echogenicity within normal limits. No mass or
hydronephrosis visualized.

Abdominal aorta:

No aneurysm visualized.

Other findings:

None.
IMPRESSION: Grossly unremarkable abdominal ultrasound.

Echogenic foci within the spleen, potentially representing small
granulomas.

## 2015-03-04 ENCOUNTER — Encounter: Payer: Self-pay | Admitting: General Practice

## 2015-04-09 ENCOUNTER — Other Ambulatory Visit: Payer: Self-pay | Admitting: Family Medicine

## 2015-04-09 NOTE — Telephone Encounter (Signed)
Med filled.  

## 2015-05-14 ENCOUNTER — Encounter: Payer: Self-pay | Admitting: Family Medicine

## 2015-05-14 ENCOUNTER — Ambulatory Visit (INDEPENDENT_AMBULATORY_CARE_PROVIDER_SITE_OTHER): Payer: Managed Care, Other (non HMO) | Admitting: Family Medicine

## 2015-05-14 ENCOUNTER — Encounter: Payer: Self-pay | Admitting: General Practice

## 2015-05-14 VITALS — BP 112/78 | HR 53 | Temp 98.0°F | Resp 16 | Ht 72.0 in | Wt 237.4 lb

## 2015-05-14 DIAGNOSIS — E119 Type 2 diabetes mellitus without complications: Secondary | ICD-10-CM

## 2015-05-14 DIAGNOSIS — I10 Essential (primary) hypertension: Secondary | ICD-10-CM

## 2015-05-14 DIAGNOSIS — E785 Hyperlipidemia, unspecified: Secondary | ICD-10-CM

## 2015-05-14 DIAGNOSIS — E1169 Type 2 diabetes mellitus with other specified complication: Secondary | ICD-10-CM

## 2015-05-14 LAB — LIPID PANEL
Cholesterol: 199 mg/dL (ref 0–200)
HDL: 74.3 mg/dL (ref >39.00)
LDL Cholesterol: 110 mg/dL — ABNORMAL HIGH (ref 0–99)
NONHDL: 124.46
Total CHOL/HDL Ratio: 3
Triglycerides: 70 mg/dL (ref 0.0–149.0)
VLDL: 14 mg/dL (ref 0.0–40.0)

## 2015-05-14 LAB — BASIC METABOLIC PANEL
BUN: 14 mg/dL (ref 6–23)
CHLORIDE: 101 meq/L (ref 96–112)
CO2: 32 meq/L (ref 19–32)
Calcium: 9.6 mg/dL (ref 8.4–10.5)
Creatinine, Ser: 1.08 mg/dL (ref 0.40–1.50)
GFR: 73.9 mL/min (ref >60.00)
Glucose, Bld: 123 mg/dL — ABNORMAL HIGH (ref 70–99)
POTASSIUM: 4.3 meq/L (ref 3.5–5.1)
Sodium: 139 mEq/L (ref 135–145)

## 2015-05-14 LAB — CBC WITH DIFFERENTIAL/PLATELET
BASOS ABS: 0 10*3/uL (ref 0.0–0.1)
BASOS PCT: 0.6 % (ref 0.0–3.0)
Eosinophils Absolute: 0.2 10*3/uL (ref 0.0–0.7)
Eosinophils Relative: 3 % (ref 0.0–5.0)
HCT: 41.5 % (ref 39.0–52.0)
HEMOGLOBIN: 14 g/dL (ref 13.0–17.0)
Lymphocytes Relative: 36.9 % (ref 12.0–46.0)
Lymphs Abs: 2.1 10*3/uL (ref 0.7–4.0)
MCHC: 33.8 g/dL (ref 30.0–36.0)
MCV: 94.7 fl (ref 78.0–100.0)
MONO ABS: 0.6 10*3/uL (ref 0.1–1.0)
MONOS PCT: 9.6 % (ref 3.0–12.0)
NEUTROS PCT: 49.9 % (ref 43.0–77.0)
Neutro Abs: 2.9 10*3/uL (ref 1.4–7.7)
Platelets: 211 10*3/uL (ref 150.0–400.0)
RBC: 4.38 Mil/uL (ref 4.22–5.81)
RDW: 13.6 % (ref 11.5–15.5)
WBC: 5.7 10*3/uL (ref 4.0–10.5)

## 2015-05-14 LAB — HEPATIC FUNCTION PANEL
ALBUMIN: 4.3 g/dL (ref 3.5–5.2)
ALT: 20 U/L (ref 0–53)
AST: 25 U/L (ref 0–37)
Alkaline Phosphatase: 54 U/L (ref 39–117)
BILIRUBIN DIRECT: 0.2 mg/dL (ref 0.0–0.3)
BILIRUBIN TOTAL: 0.7 mg/dL (ref 0.2–1.2)
TOTAL PROTEIN: 7.2 g/dL (ref 6.0–8.3)

## 2015-05-14 LAB — TSH: TSH: 1.14 u[IU]/mL (ref 0.35–4.50)

## 2015-05-14 LAB — HEMOGLOBIN A1C: HEMOGLOBIN A1C: 6 % (ref 4.6–6.5)

## 2015-05-14 NOTE — Assessment & Plan Note (Signed)
Chronic problem.  Currently diet controlled.  UTD on eye exam, foot exam.  On ACE.  Check labs.  Start meds as needed based on lab results.

## 2015-05-14 NOTE — Assessment & Plan Note (Signed)
Chronic problem, on Simvastatin daily.  Asymptomatic.  Check labs.  Adjust meds prn

## 2015-05-14 NOTE — Progress Notes (Signed)
   Subjective:    Patient ID: Tony Garza, male    DOB: 16-Dec-1955, 59 y.o.   MRN: [id number]  HPI HTN- chronic problem, on Lisinopril.  Excellent control.  No CP, SOB, HAs, visual changes, edema.  DM- chronic problem.  Diet controlled.  On ACE for renal protection, UTD on eye exam and foot exam.  Has lost 3 lbs since last visit.  Exercising daily.  No numbness or tingling of hands/feet.  Hyperlipidemia- chronic problem, on Simvastatin.  Denies abd pain, N/V, myalgias.   Review of Systems For ROS see HPI     Objective:   Physical Exam  Constitutional: He is oriented to person, place, and time. He appears well-developed and well-nourished. No distress.  HENT:  Head: Normocephalic and atraumatic.  Eyes: Conjunctivae and EOM are normal. Pupils are equal, round, and reactive to light.  Neck: Normal range of motion. Neck supple. No thyromegaly present.  Cardiovascular: Normal rate, regular rhythm, normal heart sounds and intact distal pulses.   No murmur heard. Pulmonary/Chest: Effort normal and breath sounds normal. No respiratory distress.  Abdominal: Soft. Bowel sounds are normal. He exhibits no distension.  Musculoskeletal: He exhibits no edema.  Lymphadenopathy:    He has no cervical adenopathy.  Neurological: He is alert and oriented to person, place, and time. No cranial nerve deficit.  Skin: Skin is warm and dry.  Psychiatric: He has a normal mood and affect. His behavior is normal.  Vitals reviewed.         Assessment & Plan:

## 2015-05-14 NOTE — Progress Notes (Signed)
Pre visit review using our clinic review tool, if applicable. No additional management support is needed unless otherwise documented below in the visit note. 

## 2015-05-14 NOTE — Assessment & Plan Note (Signed)
Chronic problem.  Excellent control.  Asymptomatic.  Check labs.  No anticipated med changes. 

## 2015-05-14 NOTE — Patient Instructions (Signed)
Schedule your complete physical in 4 months We'll notify you of your lab results and make any changes if needed Keep up the good work!  You look great! Call with any questions or concerns Enjoy the rest of your summer!!!

## 2015-06-05 ENCOUNTER — Other Ambulatory Visit: Payer: Self-pay | Admitting: Family Medicine

## 2015-06-05 NOTE — Telephone Encounter (Signed)
Medication filled to pharmacy as requested.   

## 2015-07-17 ENCOUNTER — Ambulatory Visit (INDEPENDENT_AMBULATORY_CARE_PROVIDER_SITE_OTHER): Payer: Managed Care, Other (non HMO) | Admitting: Family Medicine

## 2015-07-17 ENCOUNTER — Encounter: Payer: Self-pay | Admitting: Family Medicine

## 2015-07-17 ENCOUNTER — Encounter (INDEPENDENT_AMBULATORY_CARE_PROVIDER_SITE_OTHER): Payer: Self-pay

## 2015-07-17 VITALS — BP 118/62 | HR 81 | Temp 98.2°F | Ht 72.0 in | Wt 238.4 lb

## 2015-07-17 DIAGNOSIS — H6501 Acute serous otitis media, right ear: Secondary | ICD-10-CM | POA: Diagnosis not present

## 2015-07-17 DIAGNOSIS — Z23 Encounter for immunization: Secondary | ICD-10-CM

## 2015-07-17 MED ORDER — LORATADINE-PSEUDOEPHEDRINE ER 10-240 MG PO TB24
1.0000 | ORAL_TABLET | Freq: Every day | ORAL | Status: DC
Start: 2015-07-17 — End: 2016-12-19

## 2015-07-17 NOTE — Progress Notes (Signed)
   Subjective:    Patient ID: Tony Garza, male    DOB: 12-Sep-1956, 59 y.o.   MRN: [id number]  HPI Ear pain- R sided, predominately at night.  No pain during the day.  sxs started ~1 week ago.  Hx of ear infections.  No drainage.  Denies nasal congestion or sinus pressure.  No N/V.  No dizziness.  No fevers.  No known sick contacts   Review of Systems For ROS see HPI     Objective:   Physical Exam  Constitutional: He is oriented to person, place, and time. He appears well-developed and well-nourished. No distress.  HENT:  Head: Normocephalic and atraumatic.  Nose: Nose normal.  Mouth/Throat: Oropharynx is clear and moist.  L TM WNL R TM w/ area of fluid but no erythema, bulging, or evidence of infection No TTP over frontal or maxillary sinuses  Eyes: Conjunctivae and EOM are normal. Pupils are equal, round, and reactive to light.  Neck: Normal range of motion. Neck supple.  Lymphadenopathy:    He has no cervical adenopathy.  Neurological: He is alert and oriented to person, place, and time.  Skin: Skin is warm and dry.  Psychiatric: He has a normal mood and affect. His behavior is normal. Thought content normal.  Vitals reviewed.         Assessment & Plan:

## 2015-07-17 NOTE — Patient Instructions (Signed)
Follow up as scheduled Start the Claritin D once daily for the next 7-10 days and then as needed for congestion/ear pressure Drink plenty of fluids Flonase- 2 squirts each nostril daily x1 week Call if no improvement or worsening If you want to join [place] at the new [place] office, any scheduled appointments will automatically transfer and we will see you at [address] [place] [address] [place], [place], [place] [address]  Have a great weekend!

## 2015-07-17 NOTE — Assessment & Plan Note (Signed)
Pt w/ serous otitis but no evidence of bacterial infxn.  Will start short term use of decongestant and antihistamine, in combination w/ nasal steroid to open eustachian tube and allow drainage.  If no improvement in pain or sxs worsen, pt to call and I will send abx.  Pt expressed understanding and is in agreement w/ plan.

## 2015-07-17 NOTE — Progress Notes (Signed)
Pre visit review using our clinic review tool, if applicable. No additional management support is needed unless otherwise documented below in the visit note. 

## 2015-09-05 ENCOUNTER — Other Ambulatory Visit: Payer: Self-pay | Admitting: Family Medicine

## 2015-09-07 NOTE — Telephone Encounter (Signed)
Medication filled to pharmacy as requested.   

## 2015-09-16 IMAGING — CR DG ANKLE COMPLETE 3+V*L*
3 series · 3 of 3 positions shown · non-contrast
Comparison: None.

CLINICAL DATA: Pain and swelling medially, no acute injury

EXAM:
LEFT ANKLE COMPLETE - 3+ VIEW

[t ankle joint ap left]
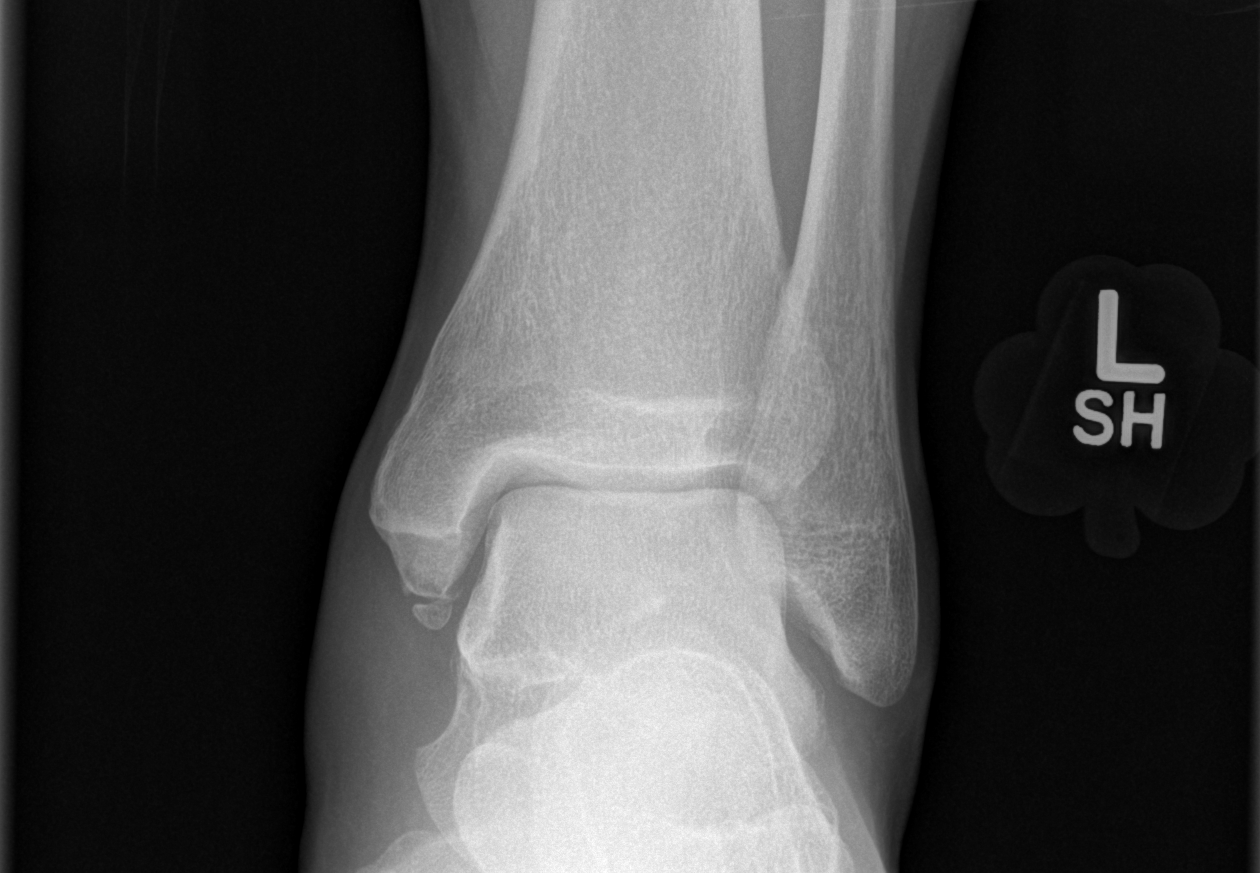

[t ankle joint oblique left]
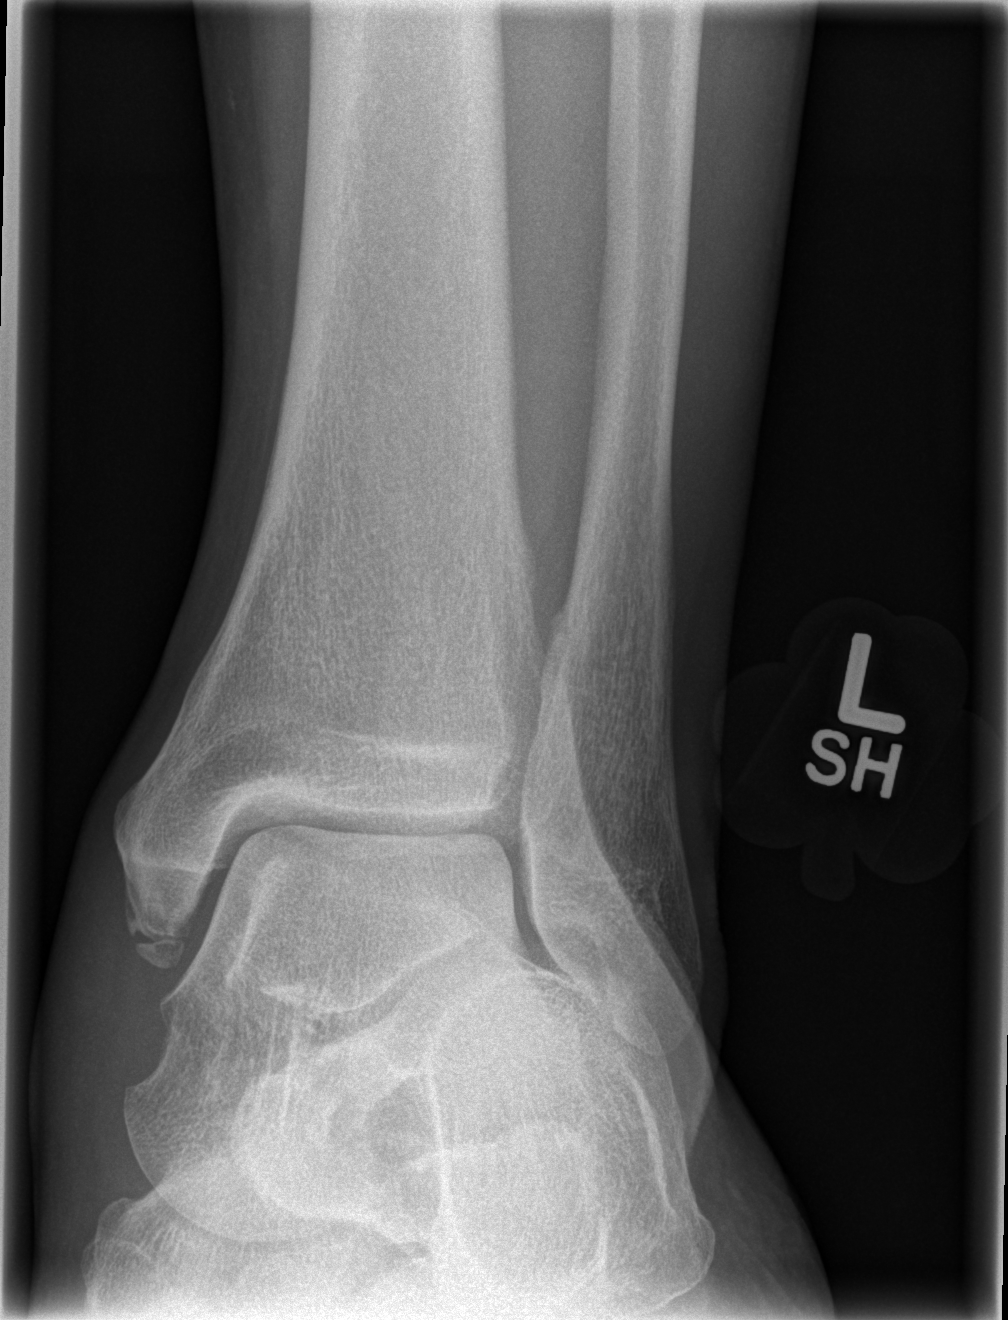

[t ankle joint lat left]
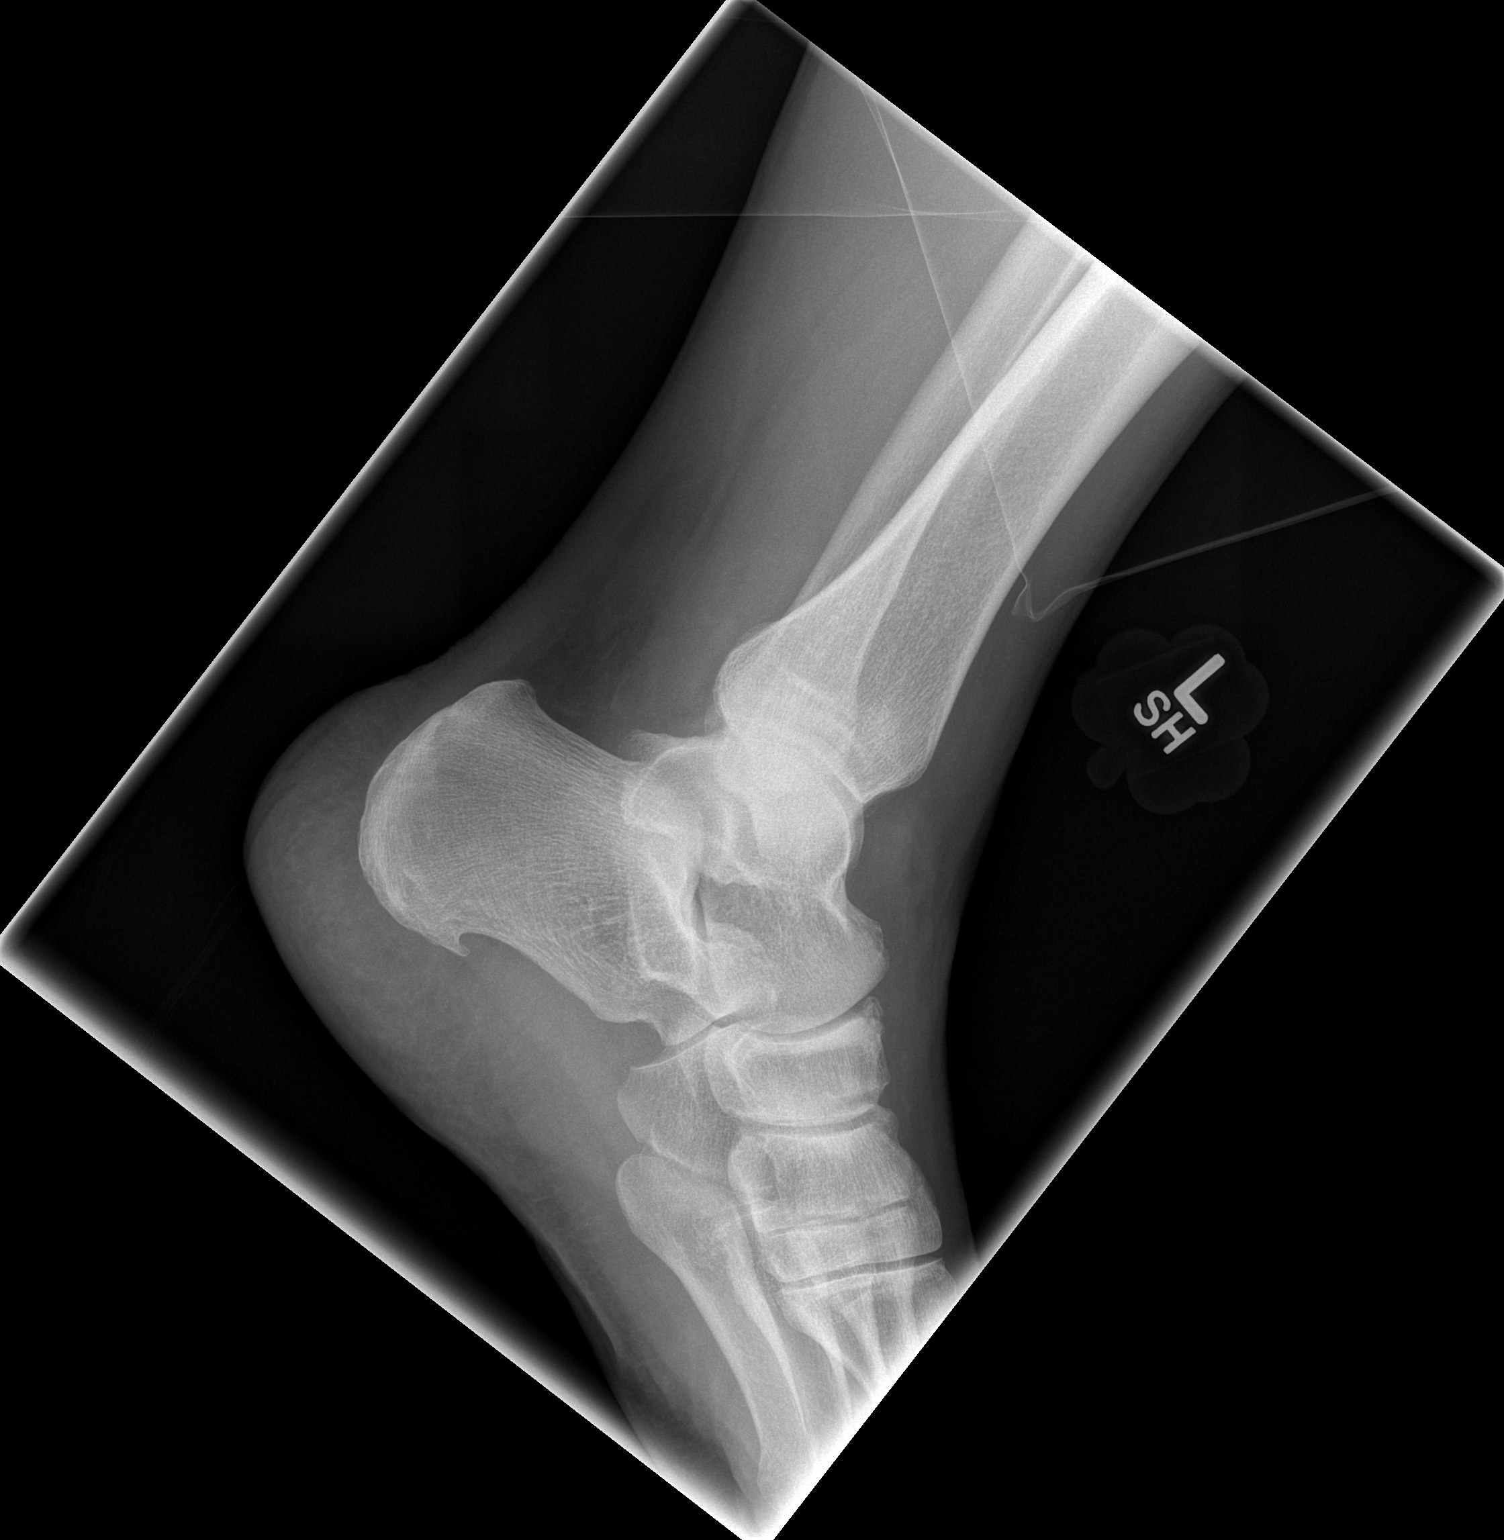

[3 of 3 positions shown; findings below may reference images not displayed]

FINDINGS: There is a well corticated bony density just beneath the tip of the
medial malleolus most likely due to prior trauma with healing. No
acute fracture is seen. The ankle joint appears normal. A small
plantar calcaneal degenerative spur is noted.
IMPRESSION: Probable old avulsion of the tip of the media malleolus. No acute
abnormality.

## 2015-10-19 ENCOUNTER — Encounter: Payer: Self-pay | Admitting: Family Medicine

## 2015-10-19 ENCOUNTER — Ambulatory Visit (INDEPENDENT_AMBULATORY_CARE_PROVIDER_SITE_OTHER): Payer: Managed Care, Other (non HMO) | Admitting: Family Medicine

## 2015-10-19 VITALS — BP 126/68 | HR 54 | Temp 98.0°F | Ht 72.0 in | Wt 240.4 lb

## 2015-10-19 DIAGNOSIS — E119 Type 2 diabetes mellitus without complications: Secondary | ICD-10-CM

## 2015-10-19 DIAGNOSIS — M24542 Contracture, left hand: Secondary | ICD-10-CM | POA: Diagnosis not present

## 2015-10-19 DIAGNOSIS — Z1211 Encounter for screening for malignant neoplasm of colon: Secondary | ICD-10-CM | POA: Diagnosis not present

## 2015-10-19 DIAGNOSIS — Z0000 Encounter for general adult medical examination without abnormal findings: Secondary | ICD-10-CM | POA: Diagnosis not present

## 2015-10-19 DIAGNOSIS — Z0001 Encounter for general adult medical examination with abnormal findings: Secondary | ICD-10-CM | POA: Diagnosis not present

## 2015-10-19 DIAGNOSIS — M24549 Contracture, unspecified hand: Secondary | ICD-10-CM | POA: Insufficient documentation

## 2015-10-19 NOTE — Assessment & Plan Note (Signed)
Diet controlled.  Asymptomatic.  UTD on eye exam.  Foot exam done today.  On ACE for renal protection.  Check labs.  Start meds prn.

## 2015-10-19 NOTE — Progress Notes (Signed)
   Subjective:    Patient ID: Tony Garza, male    DOB: 1956-05-23, 59 y.o.   MRN: [id number]  HPI CPE- pt reports he had colonoscopy done in 2008 in [place], [place].  He had a few polyps- none precancerous- and does not recall if it was 5 or 10 yr f/u.  No concerns today.   Review of Systems Patient reports no vision/hearing changes, anorexia, fever ,adenopathy, persistant/recurrent hoarseness, swallowing issues, chest pain, palpitations, edema, persistant/recurrent cough, hemoptysis, dyspnea (rest,exertional, paroxysmal nocturnal), gastrointestinal  bleeding (melena, rectal bleeding), abdominal pain, excessive heart burn, GU symptoms (dysuria, hematuria, voiding/incontinence issues) syncope, focal weakness, memory loss, numbness & tingling, skin/hair/nail changes, depression, anxiety, abnormal bruising/bleeding, musculoskeletal symptoms/signs.   Contractures of both hands- ongoing problem but now having pain in L 5th finger.    Objective:   Physical Exam BP 126/68 mmHg  Pulse 54  Temp(Src) 98 F (36.7 C) (Oral)  Ht 6' (1.829 m)  Wt 240 lb 6.4 oz (109.045 kg)  BMI 32.60 kg/m2  SpO2 100%  General Appearance:    Alert, cooperative, no distress, appears stated age  Head:    Normocephalic, without obvious abnormality, atraumatic  Eyes:    PERRL, conjunctiva/corneas clear, EOM's intact, fundi    benign, both eyes       Ears:    Normal TM's and external ear canals, both ears  Nose:   Nares normal, septum midline, mucosa normal, no drainage   or sinus tenderness  Throat:   Lips, mucosa, and tongue normal; teeth and gums normal  Neck:   Supple, symmetrical, trachea midline, no adenopathy;       thyroid:  No enlargement/tenderness/nodules  Back:     Symmetric, no curvature, ROM normal, no CVA tenderness  Lungs:     Clear to auscultation bilaterally, respirations unlabored  Chest wall:    No tenderness or deformity  Heart:    Regular rate and rhythm, S1 and S2 normal, no murmur,  rub   or gallop  Abdomen:     Soft, non-tender, bowel sounds active all four quadrants,    no masses, no organomegaly  Genitalia:    Normal male without lesion, discharge or tenderness  Rectal:    Normal tone, normal prostate, no masses or tenderness  Extremities:   Extremities normal, atraumatic, no cyanosis or edema  Pulses:   2+ and symmetric all extremities  Skin:   Skin color, texture, turgor normal, no rashes or lesions  Lymph nodes:   Cervical, supraclavicular, and axillary nodes normal  Neurologic:   CNII-XII intact. Normal strength, sensation and reflexes      throughout          Assessment & Plan:

## 2015-10-19 NOTE — Assessment & Plan Note (Signed)
Pt's PE WNL.  There is some question as to whether pt required 5 or 10 yr colonoscopy f/u so will refer to GI for repeat evaluation as it has been 9 years.  UTD on immunizations.  Check labs.  Anticipatory guidance provided.

## 2015-10-19 NOTE — Progress Notes (Signed)
Pre visit review using our clinic review tool, if applicable. No additional management support is needed unless otherwise documented below in the visit note. 

## 2015-10-19 NOTE — Patient Instructions (Signed)
Follow up in 3-4 months to recheck diabetes We'll notify you of your lab results and make any changes if needed Focus on healthy diet and regular exercise- you can do it! I'll call you with your GI appt for colonoscopy Call with any questions or concerns If you want to join [place] at the new [place] office, any scheduled appointments will automatically transfer and we will see you at [address] [place] [address], [place], [place] [address] ([place]) Happy New Year!!!

## 2015-10-19 NOTE — Assessment & Plan Note (Signed)
New to provider, pt reports this has been ongoing for years but recently worsened on L hand and is now painful.  Will refer to hand specialist for complete evaluation and tx.  Pt expressed understanding and is in agreement w/ plan.

## 2015-10-20 ENCOUNTER — Other Ambulatory Visit: Payer: Self-pay | Admitting: Physician Assistant

## 2015-10-20 LAB — CBC WITH DIFFERENTIAL/PLATELET
BASOS PCT: 0.5 % (ref 0.0–3.0)
Basophils Absolute: 0 10*3/uL (ref 0.0–0.1)
EOS ABS: 0.1 10*3/uL (ref 0.0–0.7)
Eosinophils Relative: 1.2 % (ref 0.0–5.0)
HEMATOCRIT: 43.2 % (ref 39.0–52.0)
Hemoglobin: 14.4 g/dL (ref 13.0–17.0)
LYMPHS ABS: 2 10*3/uL (ref 0.7–4.0)
LYMPHS PCT: 23.8 % (ref 12.0–46.0)
MCHC: 33.3 g/dL (ref 30.0–36.0)
MCV: 94 fl (ref 78.0–100.0)
MONOS PCT: 7.3 % (ref 3.0–12.0)
Monocytes Absolute: 0.6 10*3/uL (ref 0.1–1.0)
NEUTROS ABS: 5.7 10*3/uL (ref 1.4–7.7)
NEUTROS PCT: 67.2 % (ref 43.0–77.0)
PLATELETS: 247 10*3/uL (ref 150.0–400.0)
RBC: 4.6 Mil/uL (ref 4.22–5.81)
RDW: 13.3 % (ref 11.5–15.5)
WBC: 8.4 10*3/uL (ref 4.0–10.5)

## 2015-10-20 LAB — TSH: TSH: 1.19 u[IU]/mL (ref 0.35–4.50)

## 2015-10-20 LAB — BASIC METABOLIC PANEL
BUN: 13 mg/dL (ref 6–23)
CO2: 30 meq/L (ref 19–32)
Calcium: 9.8 mg/dL (ref 8.4–10.5)
Chloride: 99 mEq/L (ref 96–112)
Creatinine, Ser: 1.1 mg/dL (ref 0.40–1.50)
GFR: 72.25 mL/min (ref >60.00)
GLUCOSE: 106 mg/dL — AB (ref 70–99)
Potassium: 4.2 mEq/L (ref 3.5–5.1)
SODIUM: 139 meq/L (ref 135–145)

## 2015-10-20 LAB — HEPATIC FUNCTION PANEL
ALK PHOS: 60 U/L (ref 39–117)
ALT: 23 U/L (ref 0–53)
AST: 32 U/L (ref 0–37)
Albumin: 4.5 g/dL (ref 3.5–5.2)
BILIRUBIN DIRECT: 0.2 mg/dL (ref 0.0–0.3)
BILIRUBIN TOTAL: 0.8 mg/dL (ref 0.2–1.2)
TOTAL PROTEIN: 7.3 g/dL (ref 6.0–8.3)

## 2015-10-20 LAB — HEMOGLOBIN A1C: HEMOGLOBIN A1C: 6.1 % (ref 4.6–6.5)

## 2015-10-20 LAB — PSA: PSA: 2.09 ng/mL (ref 0.10–4.00)

## 2015-10-20 LAB — LIPID PANEL
Cholesterol: 203 mg/dL — ABNORMAL HIGH (ref 0–200)
HDL: 70.2 mg/dL (ref >39.00)
LDL CALC: 118 mg/dL — AB (ref 0–99)
NonHDL: 132.57
TRIGLYCERIDES: 71 mg/dL (ref 0.0–149.0)
Total CHOL/HDL Ratio: 3
VLDL: 14.2 mg/dL (ref 0.0–40.0)

## 2015-10-20 NOTE — Telephone Encounter (Signed)
Will defer further refills of patient's medications to PCP  

## 2015-10-23 DIAGNOSIS — M720 Palmar fascial fibromatosis [Dupuytren]: Secondary | ICD-10-CM | POA: Insufficient documentation

## 2015-11-05 ENCOUNTER — Other Ambulatory Visit: Payer: Self-pay | Admitting: Family Medicine

## 2015-11-05 NOTE — Telephone Encounter (Signed)
Medication filled to pharmacy as requested.   

## 2015-11-16 ENCOUNTER — Other Ambulatory Visit: Payer: Self-pay | Admitting: Orthopedic Surgery

## 2015-11-17 ENCOUNTER — Other Ambulatory Visit: Payer: Self-pay | Admitting: Family Medicine

## 2015-11-17 NOTE — Telephone Encounter (Signed)
Medication filled to pharmacy as requested.   

## 2015-12-04 ENCOUNTER — Other Ambulatory Visit: Payer: Self-pay | Admitting: Family Medicine

## 2015-12-04 NOTE — Telephone Encounter (Signed)
Medication filled to pharmacy as requested.   

## 2015-12-17 ENCOUNTER — Ambulatory Visit (HOSPITAL_BASED_OUTPATIENT_CLINIC_OR_DEPARTMENT_OTHER)
Admission: RE | Admit: 2015-12-17 | Payer: Managed Care, Other (non HMO) | Source: Ambulatory Visit | Admitting: Orthopedic Surgery

## 2015-12-17 ENCOUNTER — Encounter (HOSPITAL_BASED_OUTPATIENT_CLINIC_OR_DEPARTMENT_OTHER): Admission: RE | Payer: Self-pay | Source: Ambulatory Visit

## 2015-12-17 SURGERY — FASCIECTOMY, PALM
Anesthesia: Choice | Site: Finger | Laterality: Left

## 2016-01-05 ENCOUNTER — Other Ambulatory Visit: Payer: Self-pay | Admitting: Family Medicine

## 2016-01-05 NOTE — Telephone Encounter (Signed)
Medication filled to pharmacy as requested.   

## 2016-02-15 ENCOUNTER — Ambulatory Visit (INDEPENDENT_AMBULATORY_CARE_PROVIDER_SITE_OTHER): Payer: Managed Care, Other (non HMO) | Admitting: Family Medicine

## 2016-02-15 ENCOUNTER — Encounter: Payer: Self-pay | Admitting: Family Medicine

## 2016-02-15 VITALS — BP 124/70 | HR 57 | Temp 98.1°F | Resp 16 | Ht 72.0 in | Wt 232.5 lb

## 2016-02-15 DIAGNOSIS — E119 Type 2 diabetes mellitus without complications: Secondary | ICD-10-CM | POA: Diagnosis not present

## 2016-02-15 LAB — BASIC METABOLIC PANEL
BUN: 17 mg/dL (ref 6–23)
CALCIUM: 9.5 mg/dL (ref 8.4–10.5)
CO2: 29 meq/L (ref 19–32)
CREATININE: 0.96 mg/dL (ref 0.40–1.50)
Chloride: 101 mEq/L (ref 96–112)
GFR: 84.45 mL/min (ref >60.00)
GLUCOSE: 106 mg/dL — AB (ref 70–99)
Potassium: 4.1 mEq/L (ref 3.5–5.1)
Sodium: 139 mEq/L (ref 135–145)

## 2016-02-15 LAB — HEMOGLOBIN A1C: HEMOGLOBIN A1C: 6.2 % (ref 4.6–6.5)

## 2016-02-15 NOTE — Patient Instructions (Signed)
Follow up in 3-4 months to recheck diabetes and cholesterol We'll notify you of your lab results and make any changes if needed Keep up the good work on healthy diet and regular exercise- you look great!!! Call with any questions or concerns Thanks for sticking with [place]! Happy Spring!!!

## 2016-02-15 NOTE — Assessment & Plan Note (Signed)
Chronic problem.  Diet controlled.  On ACE for renal protection.  UTD on foot exam.  Due for eye exam- pt plans to schedule.  Applauded his healthy diet and regular exercise.  Check labs.  Start meds prn.  Pt expressed understanding and is in agreement w/ plan.

## 2016-02-15 NOTE — Progress Notes (Signed)
Pre visit review using our clinic review tool, if applicable. No additional management support is needed unless otherwise documented below in the visit note. 

## 2016-02-15 NOTE — Progress Notes (Signed)
   Subjective:    Patient ID: Tony Garza, male    DOB: [birth year], 60 y.o.   MRN: [id number]  HPI DM- chronic problem.  Currently diet controlled.  On ACE for renal protection.  UTD on foot exam.  Due for eye exam this month.  Exercising regularly.  Continues to eat a low carb diet.  No CP, SOB, HAs, visual changes, abd pain, N/V, numbness/tingling of hands/feet.  Denies symptomatic lows.   Review of Systems For ROS see HPI     Objective:   Physical Exam  Constitutional: He is oriented to person, place, and time. He appears well-developed and well-nourished. No distress.  HENT:  Head: Normocephalic and atraumatic.  Eyes: Conjunctivae and EOM are normal. Pupils are equal, round, and reactive to light.  Neck: Normal range of motion. Neck supple. No thyromegaly present.  Cardiovascular: Normal rate, regular rhythm, normal heart sounds and intact distal pulses.   No murmur heard. Pulmonary/Chest: Effort normal and breath sounds normal. No respiratory distress.  Abdominal: Soft. Bowel sounds are normal. He exhibits no distension.  Musculoskeletal: He exhibits no edema.  Lymphadenopathy:    He has no cervical adenopathy.  Neurological: He is alert and oriented to person, place, and time. No cranial nerve deficit.  Skin: Skin is warm and dry.  Psychiatric: He has a normal mood and affect. His behavior is normal.  Vitals reviewed.         Assessment & Plan:

## 2016-05-23 ENCOUNTER — Encounter: Payer: Self-pay | Admitting: Family Medicine

## 2016-05-23 ENCOUNTER — Ambulatory Visit (INDEPENDENT_AMBULATORY_CARE_PROVIDER_SITE_OTHER): Payer: Managed Care, Other (non HMO) | Admitting: Family Medicine

## 2016-05-23 VITALS — BP 123/83 | HR 83 | Temp 98.1°F | Resp 17 | Ht 72.0 in | Wt 235.4 lb

## 2016-05-23 DIAGNOSIS — H6091 Unspecified otitis externa, right ear: Secondary | ICD-10-CM | POA: Diagnosis not present

## 2016-05-23 MED ORDER — CIPROFLOXACIN-DEXAMETHASONE 0.3-0.1 % OT SUSP: 4.0000 [drp] | Freq: Two times a day (BID) | OTIC | 0 refills | Status: DC

## 2016-05-23 NOTE — Progress Notes (Signed)
Pre visit review using our clinic review tool, if applicable. No additional management support is needed unless otherwise documented below in the visit note. 

## 2016-05-23 NOTE — Patient Instructions (Signed)
Follow up on Thursday or Friday to recheck ears Use the Ciprodex twice daily in R ear Continue the Claritin D and the Flonase daily Drink plenty of fluids Call with any questions or concerns Hang in there!!!

## 2016-05-23 NOTE — Progress Notes (Signed)
   Subjective:    Patient ID: Tony Garza, male    DOB: 06-Jan-1956, 60 y.o.   MRN: [id number]  HPI Ear pain- pt reports ear fullness for 'awhile' but the pain started this weekend.  No improvement w/ Claritin D.  Using Flonase regularly.  R>L.  Some drainage from R ear last night.  No fevers.  No known sick contacts.  No dizziness.  + decreased hearing.   Review of Systems For ROS see HPI     Objective:   Physical Exam  Constitutional: He is oriented to person, place, and time. He appears well-developed and well-nourished. No distress.  HENT:  Head: Normocephalic and atraumatic.  Nose: Nose normal.  Mouth/Throat: Oropharynx is clear and moist. No oropharyngeal exudate.  L ear- clear fluid visible behind TM R ear- TM obscured by copious purulent d/c in canal.  Able to successfully remove some of the discharge but pt had difficult time tolerating this so efforts were stopped prior to clearing of canal  Eyes: Conjunctivae and EOM are normal. Pupils are equal, round, and reactive to light.  Neurological: He is alert and oriented to person, place, and time.  Psychiatric: He has a normal mood and affect. His behavior is normal. Thought content normal.  Vitals reviewed.         Assessment & Plan:  R Otitis Externa- new.  Pt w/ copious drainage in R ear.  Not able to visualize TM due to purulent drainage and debris.  Start Ciprodex twice daily and have pt return later this week to reassess.  Reviewed supportive care and red flags that should prompt return.  Pt expressed understanding and is in agreement w/ plan.

## 2016-05-27 ENCOUNTER — Encounter: Payer: Self-pay | Admitting: Family Medicine

## 2016-05-27 ENCOUNTER — Ambulatory Visit (INDEPENDENT_AMBULATORY_CARE_PROVIDER_SITE_OTHER): Payer: Managed Care, Other (non HMO) | Admitting: Family Medicine

## 2016-05-27 VITALS — BP 120/82 | HR 79 | Temp 98.1°F | Resp 16 | Ht 72.0 in | Wt 233.5 lb

## 2016-05-27 DIAGNOSIS — H66004 Acute suppurative otitis media without spontaneous rupture of ear drum, recurrent, right ear: Secondary | ICD-10-CM | POA: Diagnosis not present

## 2016-05-27 MED ORDER — AZITHROMYCIN 250 MG PO TABS: ORAL_TABLET | ORAL | 0 refills | Status: DC

## 2016-05-27 NOTE — Progress Notes (Signed)
Pre visit review using our clinic review tool, if applicable. No additional management support is needed unless otherwise documented below in the visit note. 

## 2016-05-27 NOTE — Progress Notes (Signed)
   Subjective:    Patient ID: Monson Marcille, male    DOB: Feb 16, 1956, 60 y.o.   MRN: [id number]  HPI Ear pain- R ear feels better but continues to hurt and feel full.  Now having fullness on L.  Using Ciprodex drops on R.  Taking Claritin D daily.  No fevers.  No drainage from ear.   Review of Systems For ROS see HPI     Objective:   Physical Exam  Constitutional: He is oriented to person, place, and time. He appears well-developed and well-nourished. No distress.  HENT:  Head: Normocephalic and atraumatic.  Mouth/Throat: No oropharyngeal exudate.  L TM w/ clear serous fluid behind TM.  Landmarks still visible R TM obscured by purulent drainage.  This was successfully removed w/ curette.  Cloudy, purulent fluid visible posterior to TM.  No perforation noted  Neck: Neck supple.  Lymphadenopathy:    He has no cervical adenopathy.  Neurological: He is alert and oriented to person, place, and time.  Skin: Skin is warm and dry.  Vitals reviewed.         Assessment & Plan:

## 2016-05-27 NOTE — Assessment & Plan Note (Signed)
Pt has hx of similar.  Start Zpack as this works well for pt.  Continue Ciprodex drops until Monday.  Add decongestant to daily antihistamine to improve L ear fullness.  Reviewed supportive care and red flags that should prompt return.  Pt expressed understanding and is in agreement w/ plan.

## 2016-05-27 NOTE — Patient Instructions (Addendum)
Follow up as needed Continue the Ciprodex drops until Monday Start the Zpack as directed Continue plain Claritin or Zyrtec and add OTC Sudafed for 3-5 days to improve the fullness and dry up the fluid Drink plenty of fluids Call with any questions or concerns Have a great weekend!!!

## 2016-05-31 ENCOUNTER — Other Ambulatory Visit: Payer: Self-pay | Admitting: Family Medicine

## 2016-05-31 ENCOUNTER — Ambulatory Visit (INDEPENDENT_AMBULATORY_CARE_PROVIDER_SITE_OTHER): Payer: Managed Care, Other (non HMO) | Admitting: Family Medicine

## 2016-05-31 ENCOUNTER — Encounter: Payer: Self-pay | Admitting: Family Medicine

## 2016-05-31 VITALS — BP 124/82 | HR 52 | Temp 99.1°F | Resp 16 | Ht 72.0 in | Wt 234.2 lb

## 2016-05-31 DIAGNOSIS — H66004 Acute suppurative otitis media without spontaneous rupture of ear drum, recurrent, right ear: Secondary | ICD-10-CM

## 2016-05-31 MED ORDER — AMOXICILLIN-POT CLAVULANATE 875-125 MG PO TABS
1.0000 | ORAL_TABLET | Freq: Two times a day (BID) | ORAL | 0 refills | Status: DC
Start: 2016-05-31 — End: 2016-06-20

## 2016-05-31 NOTE — Patient Instructions (Signed)
Follow up as needed Start the Augmentin twice daily w/ food Continue the heating pad and ibuprofen for pain relief We'll call you with your ENT appt Call with any questions or concerns Hang in there!!!

## 2016-05-31 NOTE — Progress Notes (Signed)
   Subjective:    Patient ID: Tony Garza, male    DOB: 31-Jan-1956, 60 y.o.   MRN: [id number]  HPI R ear pain- finished Zpack w/o improvement.  Has been using Ciprodex as directed.  Pt reports he felt worse Saturday, better on Sunday, and 'I went down hill' yesterday.  No fevers.  No sinus pain/pressure.  + nasal congestion.  No cough.  No known sick contacts.   Review of Systems For ROS see HPI     Objective:   Physical Exam  Constitutional: He is oriented to person, place, and time. He appears well-developed and well-nourished. No distress.  HENT:  Head: Normocephalic and atraumatic.  Nose: Nose normal.  Mouth/Throat: Oropharynx is clear and moist. No oropharyngeal exudate.  L TM mildly retracted but otherwise WNL R ear canal markedly erythematous w/ purulent drainage present along w/ black, spore like material.  Canal very friable- bled w/ gentle touch from curette- procedure stopped.  TM not readily visible  Neck: Normal range of motion. Neck supple.  Lymphadenopathy:    He has no cervical adenopathy.  Neurological: He is alert and oriented to person, place, and time. Coordination normal.  Skin: Skin is warm and dry.  Psychiatric: He has a normal mood and affect. His behavior is normal. Thought content normal.  Vitals reviewed.         Assessment & Plan:

## 2016-05-31 NOTE — Progress Notes (Signed)
Pre visit review using our clinic review tool, if applicable. No additional management support is needed unless otherwise documented below in the visit note. 

## 2016-05-31 NOTE — Assessment & Plan Note (Signed)
Deteriorated.  No resolution on Zpack which pt states typically works for him, no improvement w/ Ciprodex.  Ear canal now erythematous, friable, bleeding w/ purulent drainage suspicious for fungal element.  Ear is very painful for pt.  Refer to ENT for complete evaluation and tx.  Start augmentin while awaiting ENT appt.  Pt expressed understanding and is in agreement w/ plan.

## 2016-06-01 DIAGNOSIS — H9113 Presbycusis, bilateral: Secondary | ICD-10-CM | POA: Insufficient documentation

## 2016-06-03 ENCOUNTER — Other Ambulatory Visit: Payer: Self-pay | Admitting: Family Medicine

## 2016-06-16 LAB — HM DIABETES EYE EXAM

## 2016-06-20 ENCOUNTER — Encounter: Payer: Self-pay | Admitting: Family Medicine

## 2016-06-20 ENCOUNTER — Other Ambulatory Visit: Payer: Self-pay | Admitting: General Practice

## 2016-06-20 ENCOUNTER — Ambulatory Visit (INDEPENDENT_AMBULATORY_CARE_PROVIDER_SITE_OTHER): Payer: Managed Care, Other (non HMO) | Admitting: Family Medicine

## 2016-06-20 ENCOUNTER — Encounter: Payer: Self-pay | Admitting: General Practice

## 2016-06-20 VITALS — BP 122/80 | HR 54 | Temp 98.0°F | Resp 16 | Ht 72.0 in | Wt 236.1 lb

## 2016-06-20 DIAGNOSIS — Z23 Encounter for immunization: Secondary | ICD-10-CM | POA: Diagnosis not present

## 2016-06-20 DIAGNOSIS — K429 Umbilical hernia without obstruction or gangrene: Secondary | ICD-10-CM | POA: Diagnosis not present

## 2016-06-20 DIAGNOSIS — E119 Type 2 diabetes mellitus without complications: Secondary | ICD-10-CM

## 2016-06-20 DIAGNOSIS — I10 Essential (primary) hypertension: Secondary | ICD-10-CM

## 2016-06-20 DIAGNOSIS — E785 Hyperlipidemia, unspecified: Secondary | ICD-10-CM | POA: Diagnosis not present

## 2016-06-20 DIAGNOSIS — E1169 Type 2 diabetes mellitus with other specified complication: Secondary | ICD-10-CM | POA: Diagnosis not present

## 2016-06-20 LAB — CBC WITH DIFFERENTIAL/PLATELET
Basophils Absolute: 0 10*3/uL (ref 0.0–0.1)
Basophils Relative: 0.7 % (ref 0.0–3.0)
EOS PCT: 1.8 % (ref 0.0–5.0)
Eosinophils Absolute: 0.1 10*3/uL (ref 0.0–0.7)
HCT: 41.5 % (ref 39.0–52.0)
Hemoglobin: 14.2 g/dL (ref 13.0–17.0)
LYMPHS ABS: 2 10*3/uL (ref 0.7–4.0)
Lymphocytes Relative: 34.2 % (ref 12.0–46.0)
MCHC: 34.2 g/dL (ref 30.0–36.0)
MCV: 93 fl (ref 78.0–100.0)
MONO ABS: 0.5 10*3/uL (ref 0.1–1.0)
MONOS PCT: 9.1 % (ref 3.0–12.0)
NEUTROS ABS: 3.2 10*3/uL (ref 1.4–7.7)
NEUTROS PCT: 54.2 % (ref 43.0–77.0)
PLATELETS: 249 10*3/uL (ref 150.0–400.0)
RBC: 4.46 Mil/uL (ref 4.22–5.81)
RDW: 14 % (ref 11.5–15.5)
WBC: 5.9 10*3/uL (ref 4.0–10.5)

## 2016-06-20 LAB — LIPID PANEL
CHOL/HDL RATIO: 3
Cholesterol: 225 mg/dL — ABNORMAL HIGH (ref 0–200)
HDL: 80.5 mg/dL (ref >39.00)
LDL Cholesterol: 122 mg/dL — ABNORMAL HIGH (ref 0–99)
NONHDL: 144.54
Triglycerides: 112 mg/dL (ref 0.0–149.0)
VLDL: 22.4 mg/dL (ref 0.0–40.0)

## 2016-06-20 LAB — BASIC METABOLIC PANEL
BUN: 13 mg/dL (ref 6–23)
CALCIUM: 9.4 mg/dL (ref 8.4–10.5)
CO2: 32 mEq/L (ref 19–32)
CREATININE: 0.94 mg/dL (ref 0.40–1.50)
Chloride: 100 mEq/L (ref 96–112)
GFR: 86.43 mL/min (ref >60.00)
Glucose, Bld: 106 mg/dL — ABNORMAL HIGH (ref 70–99)
Potassium: 4.6 mEq/L (ref 3.5–5.1)
Sodium: 138 mEq/L (ref 135–145)

## 2016-06-20 LAB — HEPATIC FUNCTION PANEL
ALT: 22 U/L (ref 0–53)
AST: 24 U/L (ref 0–37)
Albumin: 4.5 g/dL (ref 3.5–5.2)
Alkaline Phosphatase: 56 U/L (ref 39–117)
BILIRUBIN TOTAL: 0.6 mg/dL (ref 0.2–1.2)
Bilirubin, Direct: 0.2 mg/dL (ref 0.0–0.3)
Total Protein: 7.2 g/dL (ref 6.0–8.3)

## 2016-06-20 LAB — TSH: TSH: 1.35 u[IU]/mL (ref 0.35–4.50)

## 2016-06-20 LAB — HEMOGLOBIN A1C: Hgb A1c MFr Bld: 6.1 % (ref 4.6–6.5)

## 2016-06-20 NOTE — Patient Instructions (Signed)
Schedule your complete physical in 6 months We'll notify you of your lab results and make any changes if needed Continue to work on healthy diet and regular exercise- you look great! We'll call you with your surgery appt Call with any questions or concerns Happy [birth year] Birthday!!!

## 2016-06-20 NOTE — Assessment & Plan Note (Signed)
Chronic problem.  Tolerating statin w/o difficulty.  Check labs.  Adjust meds prn  

## 2016-06-20 NOTE — Progress Notes (Signed)
Pre visit review using our clinic review tool, if applicable. No additional management support is needed unless otherwise documented below in the visit note. 

## 2016-06-20 NOTE — Assessment & Plan Note (Signed)
Chronic problem.  Diet controlled.  UTD on foot exam, eye exam, on ACE for renal protection.  Encouraged healthy diet and regular exercise.  Check labs.  Adjust tx plan prn.

## 2016-06-20 NOTE — Progress Notes (Signed)
   Subjective:    Patient ID: Addam Friedlander, male    DOB: 12/13/1955, 60 y.o.   MRN: [id number]  HPI DM- chronic problem, currently diet controlled.  Exercising regularly.  UTD on foot exam, eye exam done 9/7- no retinopathy.  On ACE for renal protection.  No numbness/tingling of hands/feet.  HTN- chronic problem, on ACE w/ good control.  No CP, SOB, HAs, visual changes, edema.  Hyperlipidemia- chronic problem, on Simvastatin.  No abd pain, N/V, myalgias.  Review of Systems For ROS see HPI     Objective:   Physical Exam  Constitutional: He is oriented to person, place, and time. He appears well-developed and well-nourished. No distress.  HENT:  Head: Normocephalic and atraumatic.  Eyes: Conjunctivae and EOM are normal. Pupils are equal, round, and reactive to light.  Neck: Normal range of motion. Neck supple. No thyromegaly present.  Cardiovascular: Normal rate, regular rhythm, normal heart sounds and intact distal pulses.   No murmur heard. Pulmonary/Chest: Effort normal and breath sounds normal. No respiratory distress.  Abdominal: Soft. Bowel sounds are normal. He exhibits no distension.  Musculoskeletal: He exhibits no edema.  Lymphadenopathy:    He has no cervical adenopathy.  Neurological: He is alert and oriented to person, place, and time. No cranial nerve deficit.  Skin: Skin is warm and dry.  Psychiatric: He has a normal mood and affect. His behavior is normal.  Vitals reviewed.         Assessment & Plan:

## 2016-06-20 NOTE — Assessment & Plan Note (Signed)
Chronic problem.  Well controlled today.  Asymptomatic.  Check labs.  No anticipated med changes. 

## 2016-06-23 ENCOUNTER — Encounter: Payer: Self-pay | Admitting: General Practice

## 2016-06-28 ENCOUNTER — Encounter: Payer: Self-pay | Admitting: Family Medicine

## 2016-07-29 ENCOUNTER — Other Ambulatory Visit: Payer: Self-pay | Admitting: Family Medicine

## 2016-08-10 HISTORY — PX: UMBILICAL HERNIA REPAIR: SHX196

## 2016-09-08 ENCOUNTER — Other Ambulatory Visit: Payer: Self-pay | Admitting: Family Medicine

## 2016-11-23 ENCOUNTER — Other Ambulatory Visit: Payer: Self-pay | Admitting: Family Medicine

## 2016-11-24 ENCOUNTER — Encounter: Payer: Self-pay | Admitting: Family Medicine

## 2016-11-24 MED ORDER — PANTOPRAZOLE SODIUM 40 MG PO TBEC: 40.0000 mg | DELAYED_RELEASE_TABLET | Freq: Every day | ORAL | 3 refills | Status: DC

## 2016-11-25 ENCOUNTER — Encounter: Payer: Self-pay | Admitting: Family Medicine

## 2016-11-25 MED ORDER — PANTOPRAZOLE SODIUM 40 MG PO TBEC: 40.0000 mg | DELAYED_RELEASE_TABLET | Freq: Every day | ORAL | 3 refills | Status: DC

## 2016-11-25 NOTE — Addendum Note (Signed)
Addended by: Midge Minium on: 11/25/2016 03:06 PM   Modules accepted: Orders

## 2016-11-29 ENCOUNTER — Telehealth: Payer: Self-pay | Admitting: *Deleted

## 2016-11-29 NOTE — Telephone Encounter (Signed)
There has been conversation about two different medications for Acid Reflux for this patient.  Received a prior authorization request for Protonix -   I left a message on the patients voicemail and asked him to call and confirm the medication that he is wanting me to do a prior authorization on.

## 2016-11-29 NOTE — Telephone Encounter (Signed)
Pt called back to state he wants Dexilant.

## 2016-12-01 NOTE — Telephone Encounter (Signed)
I have started a prior authorization through cover my meds for Dexilant 60mg .     Key: [id number]

## 2016-12-01 NOTE — Telephone Encounter (Signed)
PA had to be changed due to insurance  - new [facility]

## 2016-12-06 NOTE — Telephone Encounter (Signed)
Medication Dexilant denied by insurance due to this medication being excluded in the patient's pharmacy plan.   I have left a message for patient to call me back to discuss.

## 2016-12-07 NOTE — Telephone Encounter (Signed)
Please let pt know that his insurance refuses all PPI medications (nexium, dexilant, protonix, etc)

## 2016-12-07 NOTE — Telephone Encounter (Signed)
Spoke directly to the pharmacy services center of patient's insurance.  Any PPI Class drug is excluded from the plan, and patient must pay out of pocket.  Rep stated that they do not accept any prior authorizations for these medications, because the plan excludes them completely.  Routing message to provider to see if she has any suggestions before I call the patient back to discuss.

## 2016-12-07 NOTE — Telephone Encounter (Signed)
Patient is aware - stated verbal understanding.  He appreciated the help in trying to get something covered.  He said that he would research some solutions on his own.

## 2016-12-19 ENCOUNTER — Encounter: Payer: Self-pay | Admitting: Family Medicine

## 2016-12-19 ENCOUNTER — Ambulatory Visit (INDEPENDENT_AMBULATORY_CARE_PROVIDER_SITE_OTHER): Payer: Managed Care, Other (non HMO) | Admitting: Family Medicine

## 2016-12-19 VITALS — BP 121/80 | HR 48 | Temp 98.1°F | Resp 16 | Ht 72.0 in | Wt 246.4 lb

## 2016-12-19 DIAGNOSIS — Z0001 Encounter for general adult medical examination with abnormal findings: Secondary | ICD-10-CM | POA: Diagnosis not present

## 2016-12-19 DIAGNOSIS — R21 Rash and other nonspecific skin eruption: Secondary | ICD-10-CM | POA: Diagnosis not present

## 2016-12-19 DIAGNOSIS — Z0000 Encounter for general adult medical examination without abnormal findings: Secondary | ICD-10-CM | POA: Diagnosis not present

## 2016-12-19 DIAGNOSIS — E119 Type 2 diabetes mellitus without complications: Secondary | ICD-10-CM | POA: Diagnosis not present

## 2016-12-19 DIAGNOSIS — L989 Disorder of the skin and subcutaneous tissue, unspecified: Secondary | ICD-10-CM | POA: Diagnosis not present

## 2016-12-19 DIAGNOSIS — Z1211 Encounter for screening for malignant neoplasm of colon: Secondary | ICD-10-CM

## 2016-12-19 NOTE — Progress Notes (Addendum)
   Subjective:    Patient ID: Tony Garza, male    DOB: December 06, 1955, 61 y.o.   MRN: [id number]  HPI CPE- due for repeat colonoscopy (last done in [facility]), foot exam.  UTD on eye exam, on ACE for renal protection.   Review of Systems Patient reports no vision/hearing changes, anorexia, fever ,adenopathy, persistant/recurrent hoarseness, swallowing issues, chest pain, palpitations, edema, persistant/recurrent cough, hemoptysis, dyspnea (rest,exertional, paroxysmal nocturnal), gastrointestinal  bleeding (melena, rectal bleeding), abdominal pain, excessive heart burn, GU symptoms (dysuria, hematuria, voiding/incontinence issues) syncope, focal weakness, memory loss, numbness & tingling, hair/nail changes, depression, anxiety, abnormal bruising/bleeding, musculoskeletal symptoms/signs.   + rash on chest + mucocele- first noticed 6-8 weeks ago    Objective:   Physical Exam BP 121/80   Pulse (!) 48   Temp 98.1 F (36.7 C) (Oral)   Resp 16   Ht 6' (1.829 m)   Wt 246 lb 6 oz (111.8 kg)   SpO2 98%   BMI 33.41 kg/m   General Appearance:    Alert, cooperative, no distress, appears stated age  Head:    Normocephalic, without obvious abnormality, atraumatic  Eyes:    PERRL, conjunctiva/corneas clear, EOM's intact, fundi    benign, both eyes       Ears:    Normal TM's and external ear canals, both ears  Nose:   Nares normal, septum midline, mucosa normal, no drainage   or sinus tenderness  Throat:   Lips, + mucocele on R lateral cheek, tongue normal; teeth and gums normal  Neck:   Supple, symmetrical, trachea midline, no adenopathy;       thyroid:  No enlargement/tenderness/nodules  Back:     Symmetric, no curvature, ROM normal, no CVA tenderness  Lungs:     Clear to auscultation bilaterally, respirations unlabored  Chest wall:    No tenderness or deformity  Heart:    Regular rate and rhythm, S1 and S2 normal, no murmur, rub   or gallop  Abdomen:     Soft, non-tender, bowel sounds active  all four quadrants,    no masses, no organomegaly  Genitalia:    Normal male without lesion, discharge or tenderness  Rectal:    Normal tone, normal prostate, no masses or tenderness  Extremities:   Extremities normal, atraumatic, no cyanosis or edema  Pulses:   2+ and symmetric all extremities  Skin:   Scaling sun damage on scalp, maculopapular rash of chest  Lymph nodes:   Cervical, supraclavicular, and axillary nodes normal  Neurologic:   CNII-XII intact. Normal strength, sensation and reflexes      throughout          Assessment & Plan:

## 2016-12-19 NOTE — Assessment & Plan Note (Signed)
Chronic problem, currently diet controlled.  UTD on eye exam, foot exam done today.  On ACE for renal protection.  Check labs.  Adjust meds prn

## 2016-12-19 NOTE — Progress Notes (Signed)
Pre visit review using our clinic review tool, if applicable. No additional management support is needed unless otherwise documented below in the visit note. 

## 2016-12-19 NOTE — Assessment & Plan Note (Signed)
Pt's PE WNL w/ exception of mucocele and scalp lesions (appears pre-cancerous skin change).  Due for repeat colonoscopy- referral placed.  UTD on immunizations.  Check labs.  Anticipatory guidance provided.

## 2016-12-19 NOTE — Patient Instructions (Addendum)
Follow up in 6 months to recheck diabetes, BP, and cholesterol We'll notify you of your lab results and make any changes if needed Continue to work on healthy diet and regular exercise- you can do it! We'll call you with your GI appt for the repeat colonoscopy If the mucocele doesn't resolve, please check in with the dentist Continue to apply the Lotrisone to the rash on the chest.  If no improvement, ask the dermatologist (referral placed) Call with any questions or concerns Happy Spring!!!

## 2016-12-20 LAB — LIPID PANEL
Cholesterol: 200 mg/dL (ref 0–200)
HDL: 68.5 mg/dL (ref >39.00)
LDL CALC: 119 mg/dL — AB (ref 0–99)
NonHDL: 131.45
TRIGLYCERIDES: 64 mg/dL (ref 0.0–149.0)
Total CHOL/HDL Ratio: 3
VLDL: 12.8 mg/dL (ref 0.0–40.0)

## 2016-12-20 LAB — BASIC METABOLIC PANEL
BUN: 17 mg/dL (ref 6–23)
CO2: 32 mEq/L (ref 19–32)
CREATININE: 1.04 mg/dL (ref 0.40–1.50)
Calcium: 9.7 mg/dL (ref 8.4–10.5)
Chloride: 101 mEq/L (ref 96–112)
GFR: 76.79 mL/min (ref >60.00)
GLUCOSE: 124 mg/dL — AB (ref 70–99)
Potassium: 4.2 mEq/L (ref 3.5–5.1)
Sodium: 140 mEq/L (ref 135–145)

## 2016-12-20 LAB — HEPATIC FUNCTION PANEL
ALT: 21 U/L (ref 0–53)
AST: 27 U/L (ref 0–37)
Albumin: 4.6 g/dL (ref 3.5–5.2)
Alkaline Phosphatase: 57 U/L (ref 39–117)
Bilirubin, Direct: 0.1 mg/dL (ref 0.0–0.3)
TOTAL PROTEIN: 7.3 g/dL (ref 6.0–8.3)
Total Bilirubin: 0.6 mg/dL (ref 0.2–1.2)

## 2016-12-20 LAB — PSA: PSA: 1.54 ng/mL (ref 0.10–4.00)

## 2016-12-20 LAB — HEMOGLOBIN A1C: Hgb A1c MFr Bld: 6.3 % (ref 4.6–6.5)

## 2016-12-20 LAB — TSH: TSH: 1.62 u[IU]/mL (ref 0.35–4.50)

## 2016-12-21 ENCOUNTER — Encounter: Payer: Self-pay | Admitting: Gastroenterology

## 2017-02-08 ENCOUNTER — Ambulatory Visit (AMBULATORY_SURGERY_CENTER): Payer: Self-pay | Admitting: *Deleted

## 2017-02-08 VITALS — Ht 72.0 in | Wt 241.0 lb

## 2017-02-08 DIAGNOSIS — Z1211 Encounter for screening for malignant neoplasm of colon: Secondary | ICD-10-CM

## 2017-02-08 MED ORDER — NA SULFATE-K SULFATE-MG SULF 17.5-3.13-1.6 GM/177ML PO SOLN: ORAL | 0 refills | Status: DC

## 2017-02-08 NOTE — Progress Notes (Signed)
Patient denies any allergies to eggs or soy. Patient denies any problems with anesthesia/sedation. Patient denies any oxygen use at home and does not take any diet/weight loss medications. EMMI education assisgned to patient on colonoscopy, this was explained and instructions given to patient. 

## 2017-02-19 ENCOUNTER — Other Ambulatory Visit: Payer: Self-pay | Admitting: Family Medicine

## 2017-02-20 ENCOUNTER — Ambulatory Visit (AMBULATORY_SURGERY_CENTER): Payer: Managed Care, Other (non HMO) | Admitting: Gastroenterology

## 2017-02-20 ENCOUNTER — Other Ambulatory Visit: Payer: Self-pay | Admitting: General Practice

## 2017-02-20 ENCOUNTER — Encounter: Payer: Self-pay | Admitting: Gastroenterology

## 2017-02-20 VITALS — BP 134/67 | HR 44 | Temp 98.4°F | Resp 15 | Ht 72.0 in | Wt 241.0 lb

## 2017-02-20 DIAGNOSIS — D120 Benign neoplasm of cecum: Secondary | ICD-10-CM | POA: Diagnosis not present

## 2017-02-20 DIAGNOSIS — Z1211 Encounter for screening for malignant neoplasm of colon: Secondary | ICD-10-CM | POA: Diagnosis present

## 2017-02-20 DIAGNOSIS — Z1212 Encounter for screening for malignant neoplasm of rectum: Secondary | ICD-10-CM

## 2017-02-20 DIAGNOSIS — D123 Benign neoplasm of transverse colon: Secondary | ICD-10-CM

## 2017-02-20 MED ORDER — FLUTICASONE PROPIONATE 50 MCG/ACT NA SUSP: NASAL | 6 refills | Status: DC

## 2017-02-20 MED ORDER — SODIUM CHLORIDE 0.9 % IV SOLN: 500.0000 mL | INTRAVENOUS | Status: DC

## 2017-02-20 NOTE — Progress Notes (Signed)
Patient awakening,vss,report to rn 

## 2017-02-20 NOTE — Progress Notes (Signed)
No problems noted in the recovery room. maw 

## 2017-02-20 NOTE — Patient Instructions (Addendum)
YOU HAD AN ENDOSCOPIC PROCEDURE TODAY AT [facility]:   Refer to the procedure report that was given to you for any specific questions about what was found during the examination.  If the procedure report does not answer your questions, please call your gastroenterologist to clarify.  If you requested that your care partner not be given the details of your procedure findings, then the procedure report has been included in a sealed envelope for you to review at your convenience later.  YOU SHOULD EXPECT: Some feelings of bloating in the abdomen. Passage of more gas than usual.  Walking can help get rid of the air that was put into your GI tract during the procedure and reduce the bloating. If you had a lower endoscopy (such as a colonoscopy or flexible sigmoidoscopy) you may notice spotting of blood in your stool or on the toilet paper. If you underwent a bowel prep for your procedure, you may not have a normal bowel movement for a few days.  Please Note:  You might notice some irritation and congestion in your nose or some drainage.  This is from the oxygen used during your procedure.  There is no need for concern and it should clear up in a day or so.  SYMPTOMS TO REPORT IMMEDIATELY:   Following lower endoscopy (colonoscopy or flexible sigmoidoscopy):  Excessive amounts of blood in the stool  Significant tenderness or worsening of abdominal pains  Swelling of the abdomen that is new, acute  Fever of 100F or higher  For urgent or emergent issues, a gastroenterologist can be reached at any hour by calling [phone].   DIET:  We do recommend a small meal at first, but then you may proceed to your regular diet.  Drink plenty of fluids but you should avoid alcoholic beverages for 24 hours.  ACTIVITY:  You should plan to take it easy for the rest of today and you should NOT DRIVE or use heavy machinery until tomorrow (because of the sedation medicines used during the test).     FOLLOW UP: Our staff will call the number listed on your records the next business day following your procedure to check on you and address any questions or concerns that you may have regarding the information given to you following your procedure. If we do not reach you, we will leave a message.  However, if you are feeling well and you are not experiencing any problems, there is no need to return our call.  We will assume that you have returned to your regular daily activities without incident.  If any biopsies were taken you will be contacted by phone or by letter within the next 1-3 weeks.  Please call us at [phone] if you have not heard about the biopsies in 3 weeks.    SIGNATURES/CONFIDENTIALITY: You and/or your care partner have signed paperwork which will be entered into your electronic medical record.  These signatures attest to the fact that that the information above on your After Visit Summary has been reviewed and is understood.  Full responsibility of the confidentiality of this discharge information lies with you and/or your care-partner.   Handouts were given to your care partner on polyps and diverticulosis. You may resume your current medications today. Await your biospy results. Please call if any questions or concerns.

## 2017-02-20 NOTE — Op Note (Signed)
[facility] Patient Name: Tony Garza Procedure Date: 02/20/2017 8:22 AM MRN: [id number] Endoscopist: Mallie Mussel L. Loletha Carrow , MD Age: 61 Referring MD:  Date of Birth: [birth year] Gender: Male Account #: [id number] Procedure:                Colonoscopy Indications:              Screening for colorectal malignant neoplasm Medicines:                Monitored Anesthesia Care Procedure:                Pre-Anesthesia Assessment:                           - Prior to the procedure, a History and Physical                            was performed, and patient medications and                            allergies were reviewed. The patient's tolerance of                            previous anesthesia was also reviewed. The risks                            and benefits of the procedure and the sedation                            options and risks were discussed with the patient.                            All questions were answered, and informed consent                            was obtained. Anticoagulants: The patient has taken                            aspirin. It was decided not to withhold this                            medication prior to the procedure. ASA Grade                            Assessment: II - A patient with mild systemic                            disease. After reviewing the risks and benefits,                            the patient was deemed in satisfactory condition to                            undergo the procedure.  After obtaining informed consent, the colonoscope                            was passed under direct vision. Throughout the                            procedure, the patient's blood pressure, pulse, and                            oxygen saturations were monitored continuously. The                            Model CF-HQ190L [id number]) scope was introduced                            through the anus and advanced to the  the cecum,                            identified by appendiceal orifice and ileocecal                            valve. The colonoscopy was performed without                            difficulty. The patient tolerated the procedure                            well. The quality of the bowel preparation was                            excellent. The ileocecal valve, appendiceal                            orifice, and rectum were photographed. The quality                            of the bowel preparation was evaluated using the                            BBPS [facility] Bowel Preparation Scale) with scores                            of: Right Colon = 3, Transverse Colon = 3 and Left                            Colon = 3 (entire mucosa seen well with no residual                            staining, small fragments of stool or opaque                            liquid). The total BBPS score equals 9. The bowel  preparation used was SUPREP. Scope In: 8:32:49 AM Scope Out: 8:51:05 AM Scope Withdrawal Time: 0 hours 15 minutes 11 seconds  Total Procedure Duration: 0 hours 18 minutes 16 seconds  Findings:                 The perianal and digital rectal examinations were                            normal.                           A 2 mm polyp was found in the cecum. The polyp was                            sessile. The polyp was removed with a cold biopsy                            forceps. Resection and retrieval were complete.                           A 4 mm polyp was found in the splenic flexure. The                            polyp was sessile. The polyp was removed with a                            cold snare. Resection and retrieval were complete.                           Multiple small-mouthed diverticula were found in                            the left colon.                           The exam was otherwise without abnormality on                            direct  and retroflexion views. Complications:            No immediate complications. Estimated Blood Loss:     Estimated blood loss: none. Impression:               - One 2 mm polyp in the cecum, removed with a cold                            biopsy forceps. Resected and retrieved.                           - One 4 mm polyp at the splenic flexure, removed                            with a cold snare. Resected and retrieved.                           -  Diverticulosis in the left colon.                           - The examination was otherwise normal on direct                            and retroflexion views. Recommendation:           - Patient has a contact number available for                            emergencies. The signs and symptoms of potential                            delayed complications were discussed with the                            patient. Return to normal activities tomorrow.                            Written discharge instructions were provided to the                            patient.                           - Resume previous diet.                           - Continue present medications.                           - Await pathology results.                           - Repeat colonoscopy is recommended for                            surveillance. The colonoscopy date will be                            determined after pathology results from today's                            exam become available for review. Henry L. Loletha Carrow, MD 02/20/2017 8:55:11 AM This report has been signed electronically.

## 2017-02-20 NOTE — Progress Notes (Signed)
Called to room to assist during endoscopic procedure.  Patient ID and intended procedure confirmed with present staff. Received instructions for my participation in the procedure from the performing physician.  

## 2017-02-21 ENCOUNTER — Telehealth: Payer: Self-pay | Admitting: *Deleted

## 2017-02-21 NOTE — Telephone Encounter (Signed)
  Follow up Call-  Call back number 02/20/2017  Post procedure Call Back phone  # [phone]  Permission to leave phone message Yes  Some recent data might be hidden     Patient questions:  Do you have a fever, pain , or abdominal swelling? No. Pain Score  0 *  Have you tolerated food without any problems? Yes.    Have you been able to return to your normal activities? Yes.    Do you have any questions about your discharge instructions: Diet   No. Medications  No. Follow up visit  No.  Do you have questions or concerns about your Care? No.  Actions: * If pain score is 4 or above: No action needed, pain <4.

## 2017-02-24 ENCOUNTER — Encounter: Payer: Self-pay | Admitting: Gastroenterology

## 2017-03-09 ENCOUNTER — Other Ambulatory Visit: Payer: Self-pay | Admitting: General Practice

## 2017-03-09 MED ORDER — LISINOPRIL 10 MG PO TABS: 10.0000 mg | ORAL_TABLET | Freq: Every day | ORAL | 1 refills | Status: DC

## 2017-03-24 ENCOUNTER — Other Ambulatory Visit: Payer: Self-pay | Admitting: Family Medicine

## 2017-04-21 ENCOUNTER — Other Ambulatory Visit: Payer: Self-pay | Admitting: Family Medicine

## 2017-06-14 ENCOUNTER — Other Ambulatory Visit: Payer: Self-pay | Admitting: Family Medicine

## 2017-07-17 ENCOUNTER — Encounter: Payer: Self-pay | Admitting: Family Medicine

## 2017-08-17 ENCOUNTER — Encounter: Payer: Self-pay | Admitting: Family Medicine

## 2017-08-17 ENCOUNTER — Other Ambulatory Visit: Payer: Self-pay

## 2017-08-17 ENCOUNTER — Ambulatory Visit (INDEPENDENT_AMBULATORY_CARE_PROVIDER_SITE_OTHER): Payer: Managed Care, Other (non HMO) | Admitting: Family Medicine

## 2017-08-17 ENCOUNTER — Encounter: Payer: Self-pay | Admitting: General Practice

## 2017-08-17 VITALS — BP 128/70 | HR 60 | Temp 98.0°F | Resp 16 | Ht 72.0 in | Wt 249.5 lb

## 2017-08-17 DIAGNOSIS — Z23 Encounter for immunization: Secondary | ICD-10-CM | POA: Diagnosis not present

## 2017-08-17 DIAGNOSIS — E119 Type 2 diabetes mellitus without complications: Secondary | ICD-10-CM | POA: Diagnosis not present

## 2017-08-17 DIAGNOSIS — E1169 Type 2 diabetes mellitus with other specified complication: Secondary | ICD-10-CM

## 2017-08-17 DIAGNOSIS — I10 Essential (primary) hypertension: Secondary | ICD-10-CM | POA: Diagnosis not present

## 2017-08-17 DIAGNOSIS — E785 Hyperlipidemia, unspecified: Secondary | ICD-10-CM

## 2017-08-17 DIAGNOSIS — M25572 Pain in left ankle and joints of left foot: Secondary | ICD-10-CM | POA: Diagnosis not present

## 2017-08-17 DIAGNOSIS — G8929 Other chronic pain: Secondary | ICD-10-CM

## 2017-08-17 LAB — CBC WITH DIFFERENTIAL/PLATELET
BASOS ABS: 0.1 10*3/uL (ref 0.0–0.1)
Basophils Relative: 1.1 % (ref 0.0–3.0)
EOS ABS: 0.1 10*3/uL (ref 0.0–0.7)
Eosinophils Relative: 2.3 % (ref 0.0–5.0)
HEMATOCRIT: 42 % (ref 39.0–52.0)
HEMOGLOBIN: 14.1 g/dL (ref 13.0–17.0)
LYMPHS PCT: 30.8 % (ref 12.0–46.0)
Lymphs Abs: 1.7 10*3/uL (ref 0.7–4.0)
MCHC: 33.5 g/dL (ref 30.0–36.0)
MCV: 96.8 fl (ref 78.0–100.0)
MONOS PCT: 8.7 % (ref 3.0–12.0)
Monocytes Absolute: 0.5 10*3/uL (ref 0.1–1.0)
Neutro Abs: 3.1 10*3/uL (ref 1.4–7.7)
Neutrophils Relative %: 57.1 % (ref 43.0–77.0)
PLATELETS: 245 10*3/uL (ref 150.0–400.0)
RBC: 4.34 Mil/uL (ref 4.22–5.81)
RDW: 13 % (ref 11.5–15.5)
WBC: 5.5 10*3/uL (ref 4.0–10.5)

## 2017-08-17 LAB — LIPID PANEL
CHOL/HDL RATIO: 3
CHOLESTEROL: 204 mg/dL — AB (ref 0–200)
HDL: 72 mg/dL (ref >39.00)
LDL CALC: 115 mg/dL — AB (ref 0–99)
NonHDL: 132.09
TRIGLYCERIDES: 83 mg/dL (ref 0.0–149.0)
VLDL: 16.6 mg/dL (ref 0.0–40.0)

## 2017-08-17 LAB — HEMOGLOBIN A1C: HEMOGLOBIN A1C: 6.3 % (ref 4.6–6.5)

## 2017-08-17 LAB — BASIC METABOLIC PANEL
BUN: 17 mg/dL (ref 6–23)
CALCIUM: 9.7 mg/dL (ref 8.4–10.5)
CO2: 32 mEq/L (ref 19–32)
Chloride: 101 mEq/L (ref 96–112)
Creatinine, Ser: 1.07 mg/dL (ref 0.40–1.50)
GFR: 74.15 mL/min (ref >60.00)
GLUCOSE: 116 mg/dL — AB (ref 70–99)
POTASSIUM: 4.1 meq/L (ref 3.5–5.1)
SODIUM: 139 meq/L (ref 135–145)

## 2017-08-17 LAB — HEPATIC FUNCTION PANEL
ALBUMIN: 4.4 g/dL (ref 3.5–5.2)
ALK PHOS: 55 U/L (ref 39–117)
ALT: 19 U/L (ref 0–53)
AST: 21 U/L (ref 0–37)
Bilirubin, Direct: 0.1 mg/dL (ref 0.0–0.3)
TOTAL PROTEIN: 6.9 g/dL (ref 6.0–8.3)
Total Bilirubin: 0.6 mg/dL (ref 0.2–1.2)

## 2017-08-17 LAB — TSH: TSH: 1.09 u[IU]/mL (ref 0.35–4.50)

## 2017-08-17 MED ORDER — FLUTICASONE PROPIONATE 50 MCG/ACT NA SUSP: NASAL | 6 refills | Status: DC

## 2017-08-17 NOTE — Assessment & Plan Note (Signed)
Chronic problem.  Currently diet controlled.  UTD on foot exam, on ACE for renal protection.  Due for eye exam- pt to schedule.  Stressed need for healthy diet and regular exercise.  Check labs.  Adjust tx plan prn.

## 2017-08-17 NOTE — Progress Notes (Signed)
   Subjective:    Patient ID: Tony Garza, male    DOB: 1956/10/07, 61 y.o.   MRN: [id number]  HPI DM- chronic problem, currently diet controlled.  On ACE for renal protection.  UTD on foot exam.  Needs eye exam- pt plans to call and schedule.  Exercising regularly- 35 minutes of cardio daily, lifting weights.  No numbness/tingling of hands/feet.  HTN- chronic problem, on Lisinopril.  No CP, SOB, HAs, visual changes, edema.  Hyperlipidemia- chronic problem, on Simvastatin daily.  Has gained 9 lbs since last  Visit.  Denies abd pain, N/V, myalgias.  L ankle pain- recurrent problem.  Pt saw Dr Delfino Lovett in 2016.  Needs new referral.   Review of Systems For ROS see HPI     Objective:   Physical Exam  Constitutional: He is oriented to person, place, and time. He appears well-developed and well-nourished. No distress.  HENT:  Head: Normocephalic and atraumatic.  Eyes: Conjunctivae and EOM are normal. Pupils are equal, round, and reactive to light.  Neck: Normal range of motion. Neck supple. No thyromegaly present.  Cardiovascular: Normal rate, regular rhythm, normal heart sounds and intact distal pulses.  No murmur heard. Pulmonary/Chest: Effort normal and breath sounds normal. No respiratory distress.  Abdominal: Soft. Bowel sounds are normal. He exhibits no distension.  Musculoskeletal: He exhibits no edema.  Lymphadenopathy:    He has no cervical adenopathy.  Neurological: He is alert and oriented to person, place, and time. No cranial nerve deficit.  Skin: Skin is warm and dry.  Psychiatric: He has a normal mood and affect. His behavior is normal.  Vitals reviewed.         Assessment & Plan:

## 2017-08-17 NOTE — Assessment & Plan Note (Signed)
Chronic problem.  Pt has seen Dr Delfino Lovett in the past- will re-refer.  Pt expressed understanding and is in agreement w/ plan.

## 2017-08-17 NOTE — Patient Instructions (Signed)
Schedule your complete physical in 6 months We'll notify you of your lab results and make any changes if needed Continue to work on healthy diet and regular exercise- you're doing great! Call with any questions or concerns Happy Thanksgiving!!! 

## 2017-08-17 NOTE — Assessment & Plan Note (Signed)
Chronic problem.  Well controlled today.  Asymptomatic.  Check labs.  No anticipated med changes.  Will follow. 

## 2017-08-17 NOTE — Assessment & Plan Note (Signed)
Chronic problem.  Tolerating statin w/o difficulty.  Stressed need for healthy diet and regular exercise.  Check labs.  Adjust meds prn  

## 2017-09-08 ENCOUNTER — Other Ambulatory Visit: Payer: Self-pay | Admitting: Family Medicine

## 2017-09-12 ENCOUNTER — Ambulatory Visit: Payer: Managed Care, Other (non HMO) | Admitting: Physician Assistant

## 2017-09-12 ENCOUNTER — Encounter: Payer: Self-pay | Admitting: Physician Assistant

## 2017-09-12 VITALS — BP 130/80 | HR 53 | Temp 98.5°F | Resp 14 | Ht 72.0 in | Wt 254.0 lb

## 2017-09-12 DIAGNOSIS — J208 Acute bronchitis due to other specified organisms: Secondary | ICD-10-CM

## 2017-09-12 DIAGNOSIS — B9689 Other specified bacterial agents as the cause of diseases classified elsewhere: Secondary | ICD-10-CM | POA: Diagnosis not present

## 2017-09-12 MED ORDER — BENZONATATE 100 MG PO CAPS: 100.0000 mg | ORAL_CAPSULE | Freq: Three times a day (TID) | ORAL | 0 refills | Status: DC | PRN

## 2017-09-12 MED ORDER — AZITHROMYCIN 250 MG PO TABS: ORAL_TABLET | ORAL | 0 refills | Status: DC

## 2017-09-12 NOTE — Patient Instructions (Signed)
Take antibiotic (Azithromycin) as directed.  Increase fluids.  Get plenty of rest. Use Mucinex for congestion. Tessalon as directed for cough. Take a daily probiotic (I recommend Align or Culturelle, but even Activia Yogurt may be beneficial).  A humidifier placed in the bedroom may offer some relief for a dry, scratchy throat of nasal irritation.  Read information below on acute bronchitis. Please call or return to clinic if symptoms are not improving.  Acute Bronchitis Bronchitis is when the airways that extend from the windpipe into the lungs get red, puffy, and painful (inflamed). Bronchitis often causes thick spit (mucus) to develop. This leads to a cough. A cough is the most common symptom of bronchitis. In acute bronchitis, the condition usually begins suddenly and goes away over time (usually in 2 weeks). Smoking, allergies, and asthma can make bronchitis worse. Repeated episodes of bronchitis may cause more lung problems.  HOME CARE  Rest.  Drink enough fluids to keep your pee (urine) clear or pale yellow (unless you need to limit fluids as told by your doctor).  Only take over-the-counter or prescription medicines as told by your doctor.  Avoid smoking and secondhand smoke. These can make bronchitis worse. If you are a smoker, think about using nicotine gum or skin patches. Quitting smoking will help your lungs heal faster.  Reduce the chance of getting bronchitis again by:  Washing your hands often.  Avoiding people with cold symptoms.  Trying not to touch your hands to your mouth, nose, or eyes.  Follow up with your doctor as told.  GET HELP IF: Your symptoms do not improve after 1 week of treatment. Symptoms include:  Cough.  Fever.  Coughing up thick spit.  Body aches.  Chest congestion.  Chills.  Shortness of breath.  Sore throat.  GET HELP RIGHT AWAY IF:   You have an increased fever.  You have chills.  You have severe shortness of breath.  You  have bloody thick spit (sputum).  You throw up (vomit) often.  You lose too much body fluid (dehydration).  You have a severe headache.  You faint.  MAKE SURE YOU:   Understand these instructions.  Will watch your condition.  Will get help right away if you are not doing well or get worse. Document Released: 03/14/2008 Document Revised: 05/29/2013 Document Reviewed: 03/19/2013 [facility] Patient Information 2015 [place], [place]. This information is not intended to replace advice given to you by your health care provider. Make sure you discuss any questions you have with your health care provider.

## 2017-09-12 NOTE — Progress Notes (Signed)
Patient presents to clinic today c/o 1.5 weeks of sore throat, pnd, chest congestion and cough that has now become productive of green sputum. Has noted subjective fevers at home. Cough is worse at night. Patient denies any sinus pressure, sinus pain. Noted R ear pressure and popping. Denies recent travel or sick contact. Has taken Alka-Seltzer Cold and Flu.   Past Medical History:  Diagnosis Date  . Anxiety   . Arthritis    osteoarthritis  . Cancer [facility])    Skin cancer  . Chicken pox   . Colon polyp   . Diabetes mellitus, type 2 ([place]) 06/30/2011   controlled with diet/no meds. per pt.  . ED (erectile dysfunction)   . GERD (gastroesophageal reflux disease)   . Hyperlipidemia   . Obesity   . Peyronie disease   . Unspecified essential hypertension 06/30/2011    Current Outpatient Medications on File Prior to Visit  Medication Sig Dispense Refill  . aspirin 81 MG tablet Take 81 mg by mouth daily.      . fluticasone (FLONASE) 50 MCG/ACT nasal spray SHAKE LIQUID AND USE 2 SPRAYS IN EACH NOSTRIL DAILY 16 g 6  . glucosamine-chondroitin 500-400 MG tablet Take 1 tablet by mouth daily.      Marland Kitchen KRILL OIL PO Take by mouth.    Marland Kitchen lisinopril (PRINIVIL,ZESTRIL) 10 MG tablet TAKE 1 TABLET(10 MG) BY MOUTH DAILY 90 tablet 1  . Multiple Vitamin (MULTIVITAMIN) tablet Take 1 tablet by mouth daily.      . pantoprazole (PROTONIX) 40 MG tablet TAKE 1 TABLET(40 MG) BY MOUTH DAILY 90 tablet 1  . simvastatin (ZOCOR) 40 MG tablet TAKE 1 TABLET BY MOUTH EVERY NIGHT AT BEDTIME 90 tablet 1  . vitamin B-12 (CYANOCOBALAMIN) 1000 MCG tablet Take 1,000 mcg by mouth daily.      . vitamin C (ASCORBIC ACID) 500 MG tablet Take 500 mg by mouth daily.       No current facility-administered medications on file prior to visit.     No Known Allergies  Family History  Problem Relation Age of Onset  . Cancer Father        lymphoma  . Early death Father   . Diabetes Mother   . Cancer Mother        Lung?  . Colon  cancer Neg Hx   . Colon polyps Neg Hx   . Esophageal cancer Neg Hx   . Rectal cancer Neg Hx   . Stomach cancer Neg Hx     Social History   Socioeconomic History  . Marital status: Married    Spouse name: None  . Number of children: None  . Years of education: None  . Highest education level: None  Social Needs  . Financial resource strain: None  . Food insecurity - worry: None  . Food insecurity - inability: None  . Transportation needs - medical: None  . Transportation needs - non-medical: None  Occupational History  . Occupation: Economist: Sports administrator  Tobacco Use  . Smoking status: Former Smoker    Packs/day: 1.00    Years: 20.00    Pack years: 20.00    Last attempt to quit: 2008    Years since quitting: 10.9  . Smokeless tobacco: Never Used  Substance and Sexual Activity  . Alcohol use: Yes    Alcohol/week: 9.0 oz    Types: 15 Cans of beer per week    Comment: 15 beers per week per  pt  . Drug use: No  . Sexual activity: None  Other Topics Concern  . None  Social History Narrative   Regular exercise--no   Review of Systems - See HPI.  All other ROS are negative.  BP 130/80   Pulse (!) 53   Temp 98.5 F (36.9 C) (Oral)   Resp 14   Ht 6' (1.829 m)   Wt 254 lb (115.2 kg)   SpO2 98%   BMI 34.45 kg/m   Physical Exam  Constitutional: He is oriented to person, place, and time and well-developed, well-nourished, and in no distress.  HENT:  Head: Normocephalic and atraumatic.  Right Ear: External ear normal.  Left Ear: External ear normal.  Nose: Nose normal.  Mouth/Throat: Oropharynx is clear and moist. No oropharyngeal exudate.  TM within normal limits bilaterally  Eyes: Conjunctivae are normal.  Neck: Neck supple.  Cardiovascular: Normal rate, regular rhythm, normal heart sounds and intact distal pulses.  Pulmonary/Chest: Effort normal and breath sounds normal. No respiratory distress. He has no wheezes. He has no rales. He  exhibits no tenderness.  Lymphadenopathy:    He has no cervical adenopathy.  Neurological: He is alert and oriented to person, place, and time.  Skin: Skin is warm and dry. No rash noted.  Psychiatric: Affect normal.  Vitals reviewed.   Recent Results (from the past 2160 hour(s))  Lipid panel     Status: Abnormal   Collection Time: 08/17/17 10:37 AM  Result Value Ref Range   Cholesterol 204 (H) 0 - 200 mg/dL    Comment: ATP III Classification       Desirable:  < 200 mg/dL               Borderline High:  200 - 239 mg/dL          High:  > = 240 mg/dL   Triglycerides 83.0 0.0 - 149.0 mg/dL    Comment: Normal:  <150 mg/dLBorderline High:  150 - 199 mg/dL   HDL 72.00 >39.00 mg/dL   VLDL 16.6 0.0 - 40.0 mg/dL   LDL Cholesterol 115 (H) 0 - 99 mg/dL   Total CHOL/HDL Ratio 3     Comment:                Men          Women1/2 Average Risk     3.4          3.3Average Risk          5.0          4.42X Average Risk          9.6          7.13X Average Risk          15.0          11.0                       NonHDL 132.09     Comment: NOTE:  Non-HDL goal should be 30 mg/dL higher than patient's LDL goal (i.e. LDL goal of < 70 mg/dL, would have non-HDL goal of < 100 mg/dL)  Basic metabolic panel     Status: Abnormal   Collection Time: 08/17/17 10:37 AM  Result Value Ref Range   Sodium 139 135 - 145 mEq/L   Potassium 4.1 3.5 - 5.1 mEq/L   Chloride 101 96 - 112 mEq/L   CO2 32 19 - 32 mEq/L   Glucose, Bld  116 (H) 70 - 99 mg/dL   BUN 17 6 - 23 mg/dL   Creatinine, Ser 1.07 0.40 - 1.50 mg/dL   Calcium 9.7 8.4 - 10.5 mg/dL   GFR 74.15 >60.00 mL/min  TSH     Status: None   Collection Time: 08/17/17 10:37 AM  Result Value Ref Range   TSH 1.09 0.35 - 4.50 uIU/mL  Hepatic function panel     Status: None   Collection Time: 08/17/17 10:37 AM  Result Value Ref Range   Total Bilirubin 0.6 0.2 - 1.2 mg/dL   Bilirubin, Direct 0.1 0.0 - 0.3 mg/dL   Alkaline Phosphatase 55 39 - 117 U/L   AST 21 0 - 37 U/L    ALT 19 0 - 53 U/L   Total Protein 6.9 6.0 - 8.3 g/dL   Albumin 4.4 3.5 - 5.2 g/dL  Hemoglobin A1c     Status: None   Collection Time: 08/17/17 10:37 AM  Result Value Ref Range   Hgb A1c MFr Bld 6.3 4.6 - 6.5 %    Comment: Glycemic Control Guidelines for People with Diabetes:Non Diabetic:  <6%Goal of Therapy: <7%Additional Action Suggested:  >8%   CBC with Differential/Platelet     Status: None   Collection Time: 08/17/17 10:37 AM  Result Value Ref Range   WBC 5.5 4.0 - 10.5 K/uL   RBC 4.34 4.22 - 5.81 Mil/uL   Hemoglobin 14.1 13.0 - 17.0 g/dL   HCT 42.0 39.0 - 52.0 %   MCV 96.8 78.0 - 100.0 fl   MCHC 33.5 30.0 - 36.0 g/dL   RDW 13.0 11.5 - 15.5 %   Platelets 245.0 150.0 - 400.0 K/uL   Neutrophils Relative % 57.1 43.0 - 77.0 %   Lymphocytes Relative 30.8 12.0 - 46.0 %   Monocytes Relative 8.7 3.0 - 12.0 %   Eosinophils Relative 2.3 0.0 - 5.0 %   Basophils Relative 1.1 0.0 - 3.0 %   Neutro Abs 3.1 1.4 - 7.7 K/uL   Lymphs Abs 1.7 0.7 - 4.0 K/uL   Monocytes Absolute 0.5 0.1 - 1.0 K/uL   Eosinophils Absolute 0.1 0.0 - 0.7 K/uL   Basophils Absolute 0.1 0.0 - 0.1 K/uL    Assessment/Plan: 1. Acute bacterial bronchitis Rx Azithromycin.  Increase fluids.  Rest.  Saline nasal spray.  Probiotic.  Mucinex as directed.  Humidifier in bedroom. Tessalon per orders.  Call or return to clinic if symptoms are not improving.  - azithromycin (ZITHROMAX) 250 MG tablet; Take 2 tablets on Day 1. Then take 1 tablet daily.  Dispense: 6 tablet; Refill: 0 - benzonatate (TESSALON) 100 MG capsule; Take 1 capsule (100 mg total) by mouth 3 (three) times daily as needed for cough.  Dispense: 30 capsule; Refill: 0   [name], PA-C

## 2017-09-21 LAB — HM DIABETES EYE EXAM

## 2017-11-10 ENCOUNTER — Other Ambulatory Visit: Payer: Self-pay | Admitting: Family Medicine

## 2017-11-13 ENCOUNTER — Other Ambulatory Visit: Payer: Self-pay | Admitting: General Practice

## 2017-11-13 MED ORDER — PANTOPRAZOLE SODIUM 40 MG PO TBEC: DELAYED_RELEASE_TABLET | ORAL | 0 refills | Status: DC

## 2017-12-20 ENCOUNTER — Other Ambulatory Visit: Payer: Self-pay

## 2017-12-20 ENCOUNTER — Encounter: Payer: Self-pay | Admitting: Family Medicine

## 2017-12-20 ENCOUNTER — Ambulatory Visit (INDEPENDENT_AMBULATORY_CARE_PROVIDER_SITE_OTHER): Payer: Managed Care, Other (non HMO) | Admitting: Family Medicine

## 2017-12-20 VITALS — BP 126/80 | HR 76 | Temp 98.2°F | Resp 16 | Ht 72.0 in | Wt 255.0 lb

## 2017-12-20 DIAGNOSIS — E119 Type 2 diabetes mellitus without complications: Secondary | ICD-10-CM | POA: Diagnosis not present

## 2017-12-20 DIAGNOSIS — Z0000 Encounter for general adult medical examination without abnormal findings: Secondary | ICD-10-CM | POA: Diagnosis not present

## 2017-12-20 DIAGNOSIS — E1169 Type 2 diabetes mellitus with other specified complication: Secondary | ICD-10-CM

## 2017-12-20 DIAGNOSIS — E785 Hyperlipidemia, unspecified: Secondary | ICD-10-CM

## 2017-12-20 DIAGNOSIS — Z125 Encounter for screening for malignant neoplasm of prostate: Secondary | ICD-10-CM | POA: Diagnosis not present

## 2017-12-20 LAB — CBC WITH DIFFERENTIAL/PLATELET
BASOS ABS: 0.1 10*3/uL (ref 0.0–0.1)
Basophils Relative: 1 % (ref 0.0–3.0)
EOS ABS: 0.2 10*3/uL (ref 0.0–0.7)
Eosinophils Relative: 2.9 % (ref 0.0–5.0)
HCT: 42.3 % (ref 39.0–52.0)
Hemoglobin: 14.4 g/dL (ref 13.0–17.0)
LYMPHS ABS: 1.9 10*3/uL (ref 0.7–4.0)
Lymphocytes Relative: 33.3 % (ref 12.0–46.0)
MCHC: 33.9 g/dL (ref 30.0–36.0)
MCV: 95 fl (ref 78.0–100.0)
MONO ABS: 0.6 10*3/uL (ref 0.1–1.0)
Monocytes Relative: 10.6 % (ref 3.0–12.0)
NEUTROS ABS: 3 10*3/uL (ref 1.4–7.7)
NEUTROS PCT: 52.2 % (ref 43.0–77.0)
PLATELETS: 244 10*3/uL (ref 150.0–400.0)
RBC: 4.45 Mil/uL (ref 4.22–5.81)
RDW: 13.5 % (ref 11.5–15.5)
WBC: 5.8 10*3/uL (ref 4.0–10.5)

## 2017-12-20 LAB — HEPATIC FUNCTION PANEL
ALBUMIN: 4.5 g/dL (ref 3.5–5.2)
ALK PHOS: 55 U/L (ref 39–117)
ALT: 27 U/L (ref 0–53)
AST: 24 U/L (ref 0–37)
BILIRUBIN DIRECT: 0.2 mg/dL (ref 0.0–0.3)
TOTAL PROTEIN: 7.1 g/dL (ref 6.0–8.3)
Total Bilirubin: 0.8 mg/dL (ref 0.2–1.2)

## 2017-12-20 LAB — LIPID PANEL
CHOLESTEROL: 185 mg/dL (ref 0–200)
HDL: 73.3 mg/dL (ref >39.00)
LDL Cholesterol: 97 mg/dL (ref 0–99)
NonHDL: 111.88
TRIGLYCERIDES: 73 mg/dL (ref 0.0–149.0)
Total CHOL/HDL Ratio: 3
VLDL: 14.6 mg/dL (ref 0.0–40.0)

## 2017-12-20 LAB — BASIC METABOLIC PANEL
BUN: 13 mg/dL (ref 6–23)
CO2: 32 meq/L (ref 19–32)
Calcium: 9.9 mg/dL (ref 8.4–10.5)
Chloride: 100 mEq/L (ref 96–112)
Creatinine, Ser: 0.99 mg/dL (ref 0.40–1.50)
GFR: 81.02 mL/min (ref >60.00)
GLUCOSE: 131 mg/dL — AB (ref 70–99)
POTASSIUM: 4.4 meq/L (ref 3.5–5.1)
Sodium: 139 mEq/L (ref 135–145)

## 2017-12-20 LAB — TSH: TSH: 1.37 u[IU]/mL (ref 0.35–4.50)

## 2017-12-20 LAB — PSA: PSA: 1.94 ng/mL (ref 0.10–4.00)

## 2017-12-20 LAB — HEMOGLOBIN A1C: HEMOGLOBIN A1C: 6.5 % (ref 4.6–6.5)

## 2017-12-20 NOTE — Assessment & Plan Note (Signed)
Chronic problem.  UTD on eye exam, on ACE for renal protection.  Foot exam done today.  Stressed need for healthy diet and regular exercise.  Check labs.  Adjust meds prn

## 2017-12-20 NOTE — Assessment & Plan Note (Signed)
Pt's PE WNL w/ exception of obesity.  UTD on colonoscopy, immunizations.  Check labs.  Anticipatory guidance provided.

## 2017-12-20 NOTE — Assessment & Plan Note (Signed)
Ongoing issue for pt.  His BMI of 34.6 plus his comorbidities of diabetes, hyperlipidemia, HTN classify him as morbidly obese.  Stressed need for healthy diet and regular exercise.  Will follow.

## 2017-12-20 NOTE — Assessment & Plan Note (Signed)
Chronic problem.  Tolerating statin w/o difficulty.  Check labs.  Adjust meds prn  

## 2017-12-20 NOTE — Progress Notes (Signed)
   Subjective:    Patient ID: Tony Garza, male    DOB: December 21, 1955, 62 y.o.   MRN: [id number]  HPI CPE- UTD on colonoscopy.  UTD on immunizations.  UTD on eye exam.  Due for foot exam.   Review of Systems Patient reports no vision/hearing changes, anorexia, fever ,adenopathy, persistant/recurrent hoarseness, swallowing issues, chest pain, palpitations, edema, persistant/recurrent cough, hemoptysis, dyspnea (rest,exertional, paroxysmal nocturnal), gastrointestinal  bleeding (melena, rectal bleeding), abdominal pain, excessive heart burn, GU symptoms (dysuria, hematuria, voiding/incontinence issues) syncope, focal weakness, memory loss, numbness & tingling, skin/hair/nail changes, depression, anxiety, abnormal bruising/bleeding, musculoskeletal symptoms/signs.     Objective:   Physical Exam BP 126/80   Pulse 76   Temp 98.2 F (36.8 C) (Oral)   Resp 16   Ht 6' (1.829 m)   Wt 255 lb (115.7 kg)   SpO2 97%   BMI 34.58 kg/m   General Appearance:    Alert, cooperative, no distress, appears stated age  Head:    Normocephalic, without obvious abnormality, atraumatic  Eyes:    PERRL, conjunctiva/corneas clear, EOM's intact, fundi    benign, both eyes       Ears:    Normal TM's and external ear canals, both ears  Nose:   Nares normal, septum midline, mucosa normal, no drainage   or sinus tenderness  Throat:   Lips, mucosa, and tongue normal; teeth and gums normal  Neck:   Supple, symmetrical, trachea midline, no adenopathy;       thyroid:  No enlargement/tenderness/nodules  Back:     Symmetric, no curvature, ROM normal, no CVA tenderness  Lungs:     Clear to auscultation bilaterally, respirations unlabored  Chest wall:    No tenderness or deformity  Heart:    Regular rate and rhythm, S1 and S2 normal, no murmur, rub   or gallop  Abdomen:     Soft, non-tender, bowel sounds active all four quadrants,    no masses, no organomegaly  Genitalia:    Normal male without lesion, discharge  or tenderness  Rectal:    Normal tone, normal prostate, no masses or tenderness  Extremities:   Extremities normal, atraumatic, no cyanosis or edema  Pulses:   2+ and symmetric all extremities  Skin:   Skin color, texture, turgor normal, no rashes or lesions  Lymph nodes:   Cervical, supraclavicular, and axillary nodes normal  Neurologic:   CNII-XII intact. Normal strength, sensation and reflexes      throughout          Assessment & Plan:

## 2017-12-20 NOTE — Patient Instructions (Signed)
Follow up in 3-4 months to recheck diabetes We'll notify you of your lab results and make any changes if needed Continue to work on healthy diet and regular exercise- you can do it! Start using [employer] to help your toenail Call with any questions or concerns Happy [place]!!!

## 2017-12-21 ENCOUNTER — Encounter: Payer: Self-pay | Admitting: General Practice

## 2018-03-09 ENCOUNTER — Other Ambulatory Visit: Payer: Self-pay | Admitting: Family Medicine

## 2018-03-13 ENCOUNTER — Ambulatory Visit: Payer: Managed Care, Other (non HMO) | Admitting: Family Medicine

## 2018-03-16 ENCOUNTER — Encounter: Payer: Self-pay | Admitting: General Practice

## 2018-03-16 ENCOUNTER — Other Ambulatory Visit: Payer: Self-pay

## 2018-03-16 ENCOUNTER — Encounter: Payer: Self-pay | Admitting: Family Medicine

## 2018-03-16 ENCOUNTER — Ambulatory Visit: Payer: Managed Care, Other (non HMO) | Admitting: Family Medicine

## 2018-03-16 VITALS — BP 118/80 | HR 54 | Temp 98.1°F | Resp 15 | Ht 72.0 in | Wt 249.8 lb

## 2018-03-16 DIAGNOSIS — E119 Type 2 diabetes mellitus without complications: Secondary | ICD-10-CM | POA: Diagnosis not present

## 2018-03-16 LAB — BASIC METABOLIC PANEL
BUN: 12 mg/dL (ref 6–23)
CHLORIDE: 100 meq/L (ref 96–112)
CO2: 30 mEq/L (ref 19–32)
CREATININE: 1.05 mg/dL (ref 0.40–1.50)
Calcium: 9.7 mg/dL (ref 8.4–10.5)
GFR: 75.64 mL/min (ref >60.00)
GLUCOSE: 136 mg/dL — AB (ref 70–99)
POTASSIUM: 4.6 meq/L (ref 3.5–5.1)
Sodium: 138 mEq/L (ref 135–145)

## 2018-03-16 LAB — HEMOGLOBIN A1C: Hgb A1c MFr Bld: 6.5 % (ref 4.6–6.5)

## 2018-03-16 NOTE — Patient Instructions (Addendum)
Follow up in 3-4 months to recheck sugar, cholesterol, BP We'll notify you of your lab results and make any changes if needed Continue to work on healthy diet and regular exercise- you're doing great! Call with any questions or concerns Have a great summer!!

## 2018-03-16 NOTE — Progress Notes (Signed)
   Subjective:    Patient ID: Tony Garza, male    DOB: 1956-05-31, 62 y.o.   MRN: [id number]  HPI DM- chronic problem, attempting to control w/ diet and exercise.  On ACE for renal protection.  UTD on eye exam, foot exam.  Down 6 lbs from last visit.  Pt is working on [employer] and regular exercise.  No CP, SOB, HAs, visual changes, edema, abd pain, N/V, weakness/numbness of hands/feet.  No dizziness.   Review of Systems For ROS see HPI     Objective:   Physical Exam  Constitutional: He is oriented to person, place, and time. He appears well-developed and well-nourished. No distress.  HENT:  Head: Normocephalic and atraumatic.  Eyes: Pupils are equal, round, and reactive to light. Conjunctivae and EOM are normal.  Neck: Normal range of motion. Neck supple. No thyromegaly present.  Cardiovascular: Normal rate, regular rhythm, normal heart sounds and intact distal pulses.  No murmur heard. Pulmonary/Chest: Effort normal and breath sounds normal. No respiratory distress.  Abdominal: Soft. Bowel sounds are normal. He exhibits no distension.  Musculoskeletal: He exhibits no edema.  Lymphadenopathy:    He has no cervical adenopathy.  Neurological: He is alert and oriented to person, place, and time. No cranial nerve deficit.  Skin: Skin is warm and dry.  Psychiatric: He has a normal mood and affect. His behavior is normal.  Vitals reviewed.         Assessment & Plan:

## 2018-03-16 NOTE — Assessment & Plan Note (Signed)
Chronic problem.  Diet controlled.  UTD on foot exam, eye exam, and on ACE for renal protection.  Applauded his efforts at diet and exercise.  Check labs.  Adjust tx plan prn.

## 2018-04-06 ENCOUNTER — Other Ambulatory Visit: Payer: Self-pay | Admitting: Family Medicine

## 2018-04-13 ENCOUNTER — Other Ambulatory Visit: Payer: Self-pay | Admitting: Family Medicine

## 2018-06-22 ENCOUNTER — Other Ambulatory Visit: Payer: Self-pay

## 2018-06-22 ENCOUNTER — Ambulatory Visit: Payer: Managed Care, Other (non HMO) | Admitting: Family Medicine

## 2018-06-22 ENCOUNTER — Encounter: Payer: Self-pay | Admitting: Family Medicine

## 2018-06-22 VITALS — BP 119/78 | HR 52 | Temp 98.1°F | Resp 16 | Ht 72.0 in | Wt 247.5 lb

## 2018-06-22 DIAGNOSIS — E119 Type 2 diabetes mellitus without complications: Secondary | ICD-10-CM

## 2018-06-22 DIAGNOSIS — Z23 Encounter for immunization: Secondary | ICD-10-CM | POA: Diagnosis not present

## 2018-06-22 DIAGNOSIS — E785 Hyperlipidemia, unspecified: Secondary | ICD-10-CM

## 2018-06-22 DIAGNOSIS — E1169 Type 2 diabetes mellitus with other specified complication: Secondary | ICD-10-CM

## 2018-06-22 DIAGNOSIS — I10 Essential (primary) hypertension: Secondary | ICD-10-CM

## 2018-06-22 LAB — CBC WITH DIFFERENTIAL/PLATELET
BASOS PCT: 1.2 % (ref 0.0–3.0)
Basophils Absolute: 0.1 10*3/uL (ref 0.0–0.1)
EOS PCT: 3.1 % (ref 0.0–5.0)
Eosinophils Absolute: 0.1 10*3/uL (ref 0.0–0.7)
HCT: 43.1 % (ref 39.0–52.0)
Hemoglobin: 14.6 g/dL (ref 13.0–17.0)
LYMPHS ABS: 1.8 10*3/uL (ref 0.7–4.0)
Lymphocytes Relative: 37.6 % (ref 12.0–46.0)
MCHC: 33.9 g/dL (ref 30.0–36.0)
MCV: 94.8 fl (ref 78.0–100.0)
MONOS PCT: 9.7 % (ref 3.0–12.0)
Monocytes Absolute: 0.5 10*3/uL (ref 0.1–1.0)
NEUTROS PCT: 48.4 % (ref 43.0–77.0)
Neutro Abs: 2.3 10*3/uL (ref 1.4–7.7)
PLATELETS: 244 10*3/uL (ref 150.0–400.0)
RBC: 4.54 Mil/uL (ref 4.22–5.81)
RDW: 13.5 % (ref 11.5–15.5)
WBC: 4.8 10*3/uL (ref 4.0–10.5)

## 2018-06-22 LAB — BASIC METABOLIC PANEL
BUN: 12 mg/dL (ref 6–23)
CHLORIDE: 102 meq/L (ref 96–112)
CO2: 29 mEq/L (ref 19–32)
CREATININE: 1.08 mg/dL (ref 0.40–1.50)
Calcium: 9.7 mg/dL (ref 8.4–10.5)
GFR: 73.16 mL/min (ref >60.00)
GLUCOSE: 125 mg/dL — AB (ref 70–99)
POTASSIUM: 4.8 meq/L (ref 3.5–5.1)
Sodium: 140 mEq/L (ref 135–145)

## 2018-06-22 LAB — HEMOGLOBIN A1C: Hgb A1c MFr Bld: 6.5 % (ref 4.6–6.5)

## 2018-06-22 LAB — LIPID PANEL
CHOL/HDL RATIO: 3
Cholesterol: 197 mg/dL (ref 0–200)
HDL: 65.9 mg/dL (ref >39.00)
LDL Cholesterol: 113 mg/dL — ABNORMAL HIGH (ref 0–99)
NonHDL: 130.89
TRIGLYCERIDES: 89 mg/dL (ref 0.0–149.0)
VLDL: 17.8 mg/dL (ref 0.0–40.0)

## 2018-06-22 LAB — HEPATIC FUNCTION PANEL
ALK PHOS: 57 U/L (ref 39–117)
ALT: 18 U/L (ref 0–53)
AST: 19 U/L (ref 0–37)
Albumin: 4.5 g/dL (ref 3.5–5.2)
BILIRUBIN DIRECT: 0.2 mg/dL (ref 0.0–0.3)
Total Bilirubin: 0.8 mg/dL (ref 0.2–1.2)
Total Protein: 7.1 g/dL (ref 6.0–8.3)

## 2018-06-22 LAB — TSH: TSH: 1.33 u[IU]/mL (ref 0.35–4.50)

## 2018-06-22 NOTE — Assessment & Plan Note (Signed)
Chronic problem.  Well controlled.  Asymptomatic.  Check labs.  No anticipated med changes.  Will follow. 

## 2018-06-22 NOTE — Assessment & Plan Note (Signed)
Chronic problem.  Currently diet controlled.  UTD on foot exam, eye exam, and on ACE for renal protection.  Check labs.  Start meds prn.

## 2018-06-22 NOTE — Patient Instructions (Signed)
Follow up in 3-4 months to recheck diabetes We'll notify you of your lab results and make any changes if needed Continue to work on healthy diet and regular exercise- you're doing great! Call with any questions or concerns [place] [facility]!!!

## 2018-06-22 NOTE — Progress Notes (Signed)
   Subjective:    Patient ID: Tony Garza, male    DOB: 09/25/1956, 62 y.o.   MRN: [id number]  HPI HTN- chronic problem, well controlled on Lisinopril 10mg  daily.  No CP, SOB, HAs, visual changes, edema.  DM- chronic problem, currently diet controlled.  On ACE for renal protection.  UTD on eye exam, foot exam.  Continues to exercise regularly- both cardio and strength training.  No numbness/tingling of hands or feet.  Hyperlipidemia- chronic problem, on Simvastatin 40mg  daily.  No abd pain, N/V.   Review of Systems For ROS see HPI     Objective:   Physical Exam  Constitutional: He is oriented to person, place, and time. He appears well-developed and well-nourished. No distress.  HENT:  Head: Normocephalic and atraumatic.  Eyes: Pupils are equal, round, and reactive to light. Conjunctivae and EOM are normal.  Neck: Normal range of motion. Neck supple. No thyromegaly present.  Cardiovascular: Normal rate, regular rhythm, normal heart sounds and intact distal pulses.  No murmur heard. Pulmonary/Chest: Effort normal and breath sounds normal. No respiratory distress.  Abdominal: Soft. Bowel sounds are normal. He exhibits no distension.  Musculoskeletal: He exhibits no edema.  Lymphadenopathy:    He has no cervical adenopathy.  Neurological: He is alert and oriented to person, place, and time. No cranial nerve deficit.  Skin: Skin is warm and dry.  Psychiatric: He has a normal mood and affect. His behavior is normal.  Vitals reviewed.         Assessment & Plan:

## 2018-06-22 NOTE — Assessment & Plan Note (Signed)
Chronic problem.  Tolerating statin w/o difficulty.  Check labs.  Adjust meds prn  

## 2018-06-25 ENCOUNTER — Encounter: Payer: Self-pay | Admitting: General Practice

## 2018-06-25 ENCOUNTER — Encounter: Payer: Self-pay | Admitting: Family Medicine

## 2018-06-27 ENCOUNTER — Encounter: Payer: Self-pay | Admitting: Physician Assistant

## 2018-06-27 ENCOUNTER — Ambulatory Visit: Payer: Managed Care, Other (non HMO) | Admitting: Physician Assistant

## 2018-06-27 ENCOUNTER — Other Ambulatory Visit: Payer: Self-pay

## 2018-06-27 VITALS — BP 138/78 | HR 54 | Temp 98.3°F | Resp 16 | Ht 72.0 in | Wt 254.0 lb

## 2018-06-27 DIAGNOSIS — G44209 Tension-type headache, unspecified, not intractable: Secondary | ICD-10-CM | POA: Diagnosis not present

## 2018-06-27 LAB — CBC WITH DIFFERENTIAL/PLATELET
BASOS ABS: 0.1 10*3/uL (ref 0.0–0.1)
Basophils Relative: 1.1 % (ref 0.0–3.0)
EOS ABS: 0.2 10*3/uL (ref 0.0–0.7)
Eosinophils Relative: 3.3 % (ref 0.0–5.0)
HEMATOCRIT: 41.5 % (ref 39.0–52.0)
HEMOGLOBIN: 14.2 g/dL (ref 13.0–17.0)
LYMPHS PCT: 32.9 % (ref 12.0–46.0)
Lymphs Abs: 1.9 10*3/uL (ref 0.7–4.0)
MCHC: 34.2 g/dL (ref 30.0–36.0)
MCV: 94.4 fl (ref 78.0–100.0)
MONOS PCT: 10.1 % (ref 3.0–12.0)
Monocytes Absolute: 0.6 10*3/uL (ref 0.1–1.0)
Neutro Abs: 3 10*3/uL (ref 1.4–7.7)
Neutrophils Relative %: 52.6 % (ref 43.0–77.0)
Platelets: 236 10*3/uL (ref 150.0–400.0)
RBC: 4.39 Mil/uL (ref 4.22–5.81)
RDW: 13.3 % (ref 11.5–15.5)
WBC: 5.7 10*3/uL (ref 4.0–10.5)

## 2018-06-27 MED ORDER — TIZANIDINE HCL 4 MG PO TABS: 4.0000 mg | ORAL_TABLET | Freq: Four times a day (QID) | ORAL | 0 refills | Status: DC | PRN

## 2018-06-27 MED ORDER — MELOXICAM 15 MG PO TABS: 15.0000 mg | ORAL_TABLET | Freq: Every day | ORAL | 0 refills | Status: DC

## 2018-06-27 NOTE — Progress Notes (Signed)
Patient presents to clinic today c/o headache. Notes having some fatigue Saturday night and Sunday morning. Had mild abdominal cramping, nausea and loose stools that resolved on Sunday. Has noted headache -- posterior and into neck - over the past 2 days. Pain and neck tension with movement. No radiation into shoulders and arms. Denies trauma or injury. Neck tightness. Denies photophobia or phonophobia. Gym yesterday and did ok except for neck tension. Denies fever, chills. Nausea or vomiting. Took some Aleve which helped slightly with pain. Pain is about a 4-5/10 currently. Has been going about his daily activities without issue.   Past Medical History:  Diagnosis Date  . Anxiety   . Arthritis    osteoarthritis  . Cancer [facility])    Skin cancer  . Chicken pox   . Colon polyp   . Diabetes mellitus, type 2 ([place]) 06/30/2011   controlled with diet/no meds. per pt.  . ED (erectile dysfunction)   . GERD (gastroesophageal reflux disease)   . Hyperlipidemia   . Obesity   . Peyronie disease   . Unspecified essential hypertension 06/30/2011    Current Outpatient Medications on File Prior to Visit  Medication Sig Dispense Refill  . aspirin 81 MG tablet Take 81 mg by mouth daily.      Marland Kitchen glucosamine-chondroitin 500-400 MG tablet Take 1 tablet by mouth daily.      Marland Kitchen KRILL OIL PO Take by mouth.    Marland Kitchen lisinopril (PRINIVIL,ZESTRIL) 10 MG tablet TAKE 1 TABLET BY MOUTH DAILY 30 tablet 6  . Multiple Vitamin (MULTIVITAMIN) tablet Take 1 tablet by mouth daily.      . pantoprazole (PROTONIX) 40 MG tablet TAKE 1 TABLET BY MOUTH EVERY DAY 90 tablet 0  . simvastatin (ZOCOR) 40 MG tablet TAKE 1 TABLET BY MOUTH DAILY 30 tablet 6  . vitamin B-12 (CYANOCOBALAMIN) 1000 MCG tablet Take 1,000 mcg by mouth daily.      . vitamin C (ASCORBIC ACID) 500 MG tablet Take 500 mg by mouth daily.       No current facility-administered medications on file prior to visit.     No Known Allergies  Family History  Problem  Relation Age of Onset  . Cancer Father        lymphoma  . Early death Father   . Diabetes Mother   . Cancer Mother        Lung?  . Colon cancer Neg Hx   . Colon polyps Neg Hx   . Esophageal cancer Neg Hx   . Rectal cancer Neg Hx   . Stomach cancer Neg Hx     Social History   Socioeconomic History  . Marital status: Married    Spouse name: Not on file  . Number of children: Not on file  . Years of education: Not on file  . Highest education level: Not on file  Occupational History  . Occupation: Economist: [employer]  . Financial resource strain: Not on file  . Food insecurity:    Worry: Not on file    Inability: Not on file  . Transportation needs:    Medical: Not on file    Non-medical: Not on file  Tobacco Use  . Smoking status: Former Smoker    Packs/day: 1.00    Years: 20.00    Pack years: 20.00    Last attempt to quit: 2008    Years since quitting: 11.7  . Smokeless tobacco: Never  Used  Substance and Sexual Activity  . Alcohol use: Yes    Alcohol/week: 15.0 standard drinks    Types: 15 Cans of beer per week    Comment: 15 beers per week per pt  . Drug use: No  . Sexual activity: Not on file  Lifestyle  . Physical activity:    Days per week: Not on file    Minutes per session: Not on file  . Stress: Not on file  Relationships  . Social connections:    Talks on phone: Not on file    Gets together: Not on file    Attends religious service: Not on file    Active member of club or organization: Not on file    Attends meetings of clubs or organizations: Not on file    Relationship status: Not on file  Other Topics Concern  . Not on file  Social History Narrative   Regular exercise--no   Review of Systems - See HPI.  All other ROS are negative.  BP 138/78   Pulse (!) 54   Temp 98.3 F (36.8 C) (Oral)   Resp 16   Ht 6' (1.829 m)   Wt 254 lb (115.2 kg)   SpO2 98%   BMI 34.45 kg/m   Physical Exam    Constitutional: He appears well-developed and well-nourished.  Non-toxic appearance. He does not appear ill. No distress.  HENT:  Head: Normocephalic and atraumatic.  Mouth/Throat: Oropharynx is clear and moist.  Eyes: Pupils are equal, round, and reactive to light. EOM are normal.  Neck: Neck supple. Muscular tenderness present. No spinous process tenderness present. No neck rigidity. Normal range of motion present.  Tension noted in bilateral trapezius muscles.  Negative meningeal signs.  Cardiovascular: Normal rate, regular rhythm and normal heart sounds.  Pulmonary/Chest: Effort normal. No respiratory distress. He exhibits no tenderness.    Recent Results (from the past 2160 hour(s))  Hemoglobin A1c     Status: None   Collection Time: 06/22/18  9:32 AM  Result Value Ref Range   Hgb A1c MFr Bld 6.5 4.6 - 6.5 %    Comment: Glycemic Control Guidelines for People with Diabetes:Non Diabetic:  <6%Goal of Therapy: <7%Additional Action Suggested:  >8%   Lipid panel     Status: Abnormal   Collection Time: 06/22/18  9:32 AM  Result Value Ref Range   Cholesterol 197 0 - 200 mg/dL    Comment: ATP III Classification       Desirable:  < 200 mg/dL               Borderline High:  200 - 239 mg/dL          High:  > = 240 mg/dL   Triglycerides 89.0 0.0 - 149.0 mg/dL    Comment: Normal:  <150 mg/dLBorderline High:  150 - 199 mg/dL   HDL 65.90 >39.00 mg/dL   VLDL 17.8 0.0 - 40.0 mg/dL   LDL Cholesterol 113 (H) 0 - 99 mg/dL   Total CHOL/HDL Ratio 3     Comment:                Men          Women1/2 Average Risk     3.4          3.3Average Risk          5.0          4.42X Average Risk  9.6          7.13X Average Risk          15.0          11.0                       NonHDL 130.89     Comment: NOTE:  Non-HDL goal should be 30 mg/dL higher than patient's LDL goal (i.e. LDL goal of < 70 mg/dL, would have non-HDL goal of < 100 mg/dL)  Basic metabolic panel     Status: Abnormal   Collection Time:  06/22/18  9:32 AM  Result Value Ref Range   Sodium 140 135 - 145 mEq/L   Potassium 4.8 3.5 - 5.1 mEq/L   Chloride 102 96 - 112 mEq/L   CO2 29 19 - 32 mEq/L   Glucose, Bld 125 (H) 70 - 99 mg/dL   BUN 12 6 - 23 mg/dL   Creatinine, Ser 1.08 0.40 - 1.50 mg/dL   Calcium 9.7 8.4 - 10.5 mg/dL   GFR 73.16 >60.00 mL/min  TSH     Status: None   Collection Time: 06/22/18  9:32 AM  Result Value Ref Range   TSH 1.33 0.35 - 4.50 uIU/mL  Hepatic function panel     Status: None   Collection Time: 06/22/18  9:32 AM  Result Value Ref Range   Total Bilirubin 0.8 0.2 - 1.2 mg/dL   Bilirubin, Direct 0.2 0.0 - 0.3 mg/dL   Alkaline Phosphatase 57 39 - 117 U/L   AST 19 0 - 37 U/L   ALT 18 0 - 53 U/L   Total Protein 7.1 6.0 - 8.3 g/dL   Albumin 4.5 3.5 - 5.2 g/dL  CBC with Differential/Platelet     Status: None   Collection Time: 06/22/18  9:32 AM  Result Value Ref Range   WBC 4.8 4.0 - 10.5 K/uL   RBC 4.54 4.22 - 5.81 Mil/uL   Hemoglobin 14.6 13.0 - 17.0 g/dL   HCT 43.1 39.0 - 52.0 %   MCV 94.8 78.0 - 100.0 fl   MCHC 33.9 30.0 - 36.0 g/dL   RDW 13.5 11.5 - 15.5 %   Platelets 244.0 150.0 - 400.0 K/uL   Neutrophils Relative % 48.4 43.0 - 77.0 %   Lymphocytes Relative 37.6 12.0 - 46.0 %   Monocytes Relative 9.7 3.0 - 12.0 %   Eosinophils Relative 3.1 0.0 - 5.0 %   Basophils Relative 1.2 0.0 - 3.0 %   Neutro Abs 2.3 1.4 - 7.7 K/uL   Lymphs Abs 1.8 0.7 - 4.0 K/uL   Monocytes Absolute 0.5 0.1 - 1.0 K/uL   Eosinophils Absolute 0.1 0.0 - 0.7 K/uL   Basophils Absolute 0.1 0.0 - 0.1 K/uL    Assessment/Plan: 1. Tension headache As most likely culprit of symptoms giving characteristic of headache and exam findings. He is afebrile without meningeal signs. Will check CBC today at patient request. Will start Meloxicam. Tylenol for breakthrough pain. Will start Tizanidine. Strict return precautions given.  - CBC w/Diff - meloxicam (MOBIC) 15 MG tablet; Take 1 tablet (15 mg total) by mouth daily.   Dispense: 10 tablet; Refill: 0 - tiZANidine (ZANAFLEX) 4 MG tablet; Take 1 tablet (4 mg total) by mouth every 6 (six) hours as needed for muscle spasms.  Dispense: 15 tablet; Refill: 0   [name], PA-C

## 2018-06-27 NOTE — Patient Instructions (Addendum)
Please go to the lab today for blood work.  I will call you with your results. We will alter treatment regimen(s) if indicated by your results.   Take the Meloxicam once daily with food. Tylenol for breakthrough pain. Take the Tizanidine in the evening. Can take up to as directed but no driving or operating heavy machinery while on this medication.  If symptoms are not easing up or you note any new symptoms, please call or come see [place] ASAP.

## 2018-06-28 ENCOUNTER — Other Ambulatory Visit: Payer: Self-pay | Admitting: Physician Assistant

## 2018-06-28 DIAGNOSIS — G44209 Tension-type headache, unspecified, not intractable: Secondary | ICD-10-CM

## 2018-07-04 ENCOUNTER — Other Ambulatory Visit: Payer: Self-pay | Admitting: Physician Assistant

## 2018-07-04 DIAGNOSIS — G44209 Tension-type headache, unspecified, not intractable: Secondary | ICD-10-CM

## 2018-07-04 NOTE — Telephone Encounter (Signed)
Will defer further refills of patient's medications to PCP  

## 2018-07-08 ENCOUNTER — Other Ambulatory Visit: Payer: Self-pay | Admitting: Family Medicine

## 2018-08-03 ENCOUNTER — Other Ambulatory Visit: Payer: Self-pay | Admitting: Family Medicine

## 2018-08-03 DIAGNOSIS — G44209 Tension-type headache, unspecified, not intractable: Secondary | ICD-10-CM

## 2018-08-22 ENCOUNTER — Other Ambulatory Visit: Payer: Self-pay | Admitting: Family Medicine

## 2018-09-01 ENCOUNTER — Other Ambulatory Visit: Payer: Self-pay | Admitting: Family Medicine

## 2018-09-01 DIAGNOSIS — G44209 Tension-type headache, unspecified, not intractable: Secondary | ICD-10-CM

## 2018-09-26 LAB — HM DIABETES EYE EXAM

## 2018-09-27 ENCOUNTER — Other Ambulatory Visit: Payer: Self-pay

## 2018-09-27 ENCOUNTER — Encounter: Payer: Self-pay | Admitting: Family Medicine

## 2018-09-27 ENCOUNTER — Ambulatory Visit: Payer: Managed Care, Other (non HMO) | Admitting: Family Medicine

## 2018-09-27 VITALS — BP 123/83 | HR 62 | Temp 98.1°F | Resp 16 | Ht 72.0 in | Wt 254.4 lb

## 2018-09-27 DIAGNOSIS — R059 Cough, unspecified: Secondary | ICD-10-CM

## 2018-09-27 DIAGNOSIS — E119 Type 2 diabetes mellitus without complications: Secondary | ICD-10-CM | POA: Diagnosis not present

## 2018-09-27 DIAGNOSIS — R05 Cough: Secondary | ICD-10-CM

## 2018-09-27 LAB — BASIC METABOLIC PANEL
BUN: 13 mg/dL (ref 6–23)
CALCIUM: 9.3 mg/dL (ref 8.4–10.5)
CHLORIDE: 101 meq/L (ref 96–112)
CO2: 31 mEq/L (ref 19–32)
CREATININE: 1.04 mg/dL (ref 0.40–1.50)
GFR: 76.35 mL/min (ref >60.00)
Glucose, Bld: 141 mg/dL — ABNORMAL HIGH (ref 70–99)
Potassium: 4.5 mEq/L (ref 3.5–5.1)
Sodium: 140 mEq/L (ref 135–145)

## 2018-09-27 LAB — HEMOGLOBIN A1C: HEMOGLOBIN A1C: 6.5 % (ref 4.6–6.5)

## 2018-09-27 NOTE — Patient Instructions (Signed)
Schedule your complete physical after 3/13 We'll notify you of your lab results and make any changes if needed Continue to work on healthy diet and regular exercise- you can do it! Continue the Flonase- 2 sprays each nostril- daily ADD Claritin or Zyrtec daily to decrease post-nasal drip and improve cough Call with any questions or concerns Happy Holidays!!!

## 2018-09-27 NOTE — Progress Notes (Signed)
   Subjective:    Patient ID: Tony Garza, male    DOB: October 10, 1956, 62 y.o.   MRN: [id number]  HPI DM- chronic problem, currently diet controlled.  Last A1C 6.5  On ACE for renal protection.  UTD on eye exam, foot exam.  No CP, SOB, HAs, visual changes, edema, abd pain, N/V, weakness/numbness of hands/feet.  Cough- sxs started ~1 week ago.  Improves during the day but keeps him awake at night.  + nasal congestion.  Taking AlkaSeltzer Cold Plus at night which allows him to sleep until midnight.  Review of Systems For ROS see HPI     Objective:   Physical Exam Vitals signs reviewed.  Constitutional:      General: He is not in acute distress.    Appearance: Normal appearance. He is well-developed.  HENT:     Head: Normocephalic and atraumatic.     Nose: Congestion present.     Mouth/Throat:     Mouth: Mucous membranes are moist.     Comments: + PND Eyes:     Conjunctiva/sclera: Conjunctivae normal.     Pupils: Pupils are equal, round, and reactive to light.  Neck:     Musculoskeletal: Normal range of motion and neck supple.  Cardiovascular:     Rate and Rhythm: Normal rate and regular rhythm.     Pulses: Normal pulses.     Heart sounds: Normal heart sounds.  Pulmonary:     Effort: Pulmonary effort is normal. No respiratory distress.     Breath sounds: Normal breath sounds. No wheezing.  Abdominal:     General: There is no distension.     Palpations: Abdomen is soft.     Tenderness: There is no abdominal tenderness. There is no guarding or rebound.  Lymphadenopathy:     Cervical: No cervical adenopathy.  Skin:    General: Skin is warm and dry.  Neurological:     General: No focal deficit present.     Mental Status: He is alert and oriented to person, place, and time.  Psychiatric:        Mood and Affect: Mood normal.        Behavior: Behavior normal.        Thought Content: Thought content normal.           Assessment & Plan:  Cough- consistent w/ PND.   Continue Flonase.  Add Claritin or Zyrtec.  Reviewed supportive care and red flags that should prompt return.  Pt expressed understanding and is in agreement w/ plan.

## 2018-09-27 NOTE — Assessment & Plan Note (Signed)
Chronic problem.  Diet controlled.  Asymptomatic.  UTD on foot exam, eye exam, and on ACE for renal protection.  Check labs and determine if medication is needed.  Will follow.

## 2018-10-05 ENCOUNTER — Ambulatory Visit: Payer: Self-pay

## 2018-10-05 MED ORDER — AMOXICILLIN 875 MG PO TABS: 875.0000 mg | ORAL_TABLET | Freq: Two times a day (BID) | ORAL | 0 refills | Status: DC

## 2018-10-05 NOTE — Telephone Encounter (Signed)
Since pt has diabetes and is at higher risk for bacterial sinusitis, I will send in Amoxicillin to local pharmacy.  If this doesn't improve sxs, he will need office visit

## 2018-10-05 NOTE — Telephone Encounter (Signed)
Spoke with pt to inform.  

## 2018-10-05 NOTE — Telephone Encounter (Signed)
Pt. Reports he has been feeling bad x 3 weeks. OTC not helping. Has sinus pain and congestion that is draining "down my throat causing a pretty bad cough. I'm not sleeping at night and not getting better." Has a sore throat as well. Has thick green drainage from nose and with cough. No fever. Request an antibiotic be called to his pharmacy.Please advise pt.  Answer Assessment - Initial Assessment Questions 1. LOCATION: "Where does it hurt?"      Discomfort yes. 2. ONSET: "When did the sinus pain start?"  (e.g., hours, days)      2 weeks ago 3. SEVERITY: "How bad is the pain?"   (Scale 1-10; mild, moderate or severe)   - MILD (1-3): doesn't interfere with normal activities    - MODERATE (4-7): interferes with normal activities (e.g., work or school) or awakens from sleep   - SEVERE (8-10): excruciating pain and patient unable to do any normal activities         10 with coughing 4. RECURRENT SYMPTOM: "Have you ever had sinus problems before?" If so, ask: "When was the last time?" and "What happened that time?"      Yes 5. NASAL CONGESTION: "Is the nose blocked?" If so, ask, "Can you open it or must you breathe through the mouth?"     Can breath 6. NASAL DISCHARGE: "Do you have discharge from your nose?" If so ask, "What color?"     Thick green 7. FEVER: "Do you have a fever?" If so, ask: "What is it, how was it measured, and when did it start?"      No 8. OTHER SYMPTOMS: "Do you have any other symptoms?" (e.g., sore throat, cough, earache, difficulty breathing)     Cough,sore throat, sinus 9. PREGNANCY: "Is there any chance you are pregnant?" "When was your last menstrual period?"     n/a  Protocols used: SINUS PAIN OR CONGESTION-A-AH

## 2018-10-05 NOTE — Addendum Note (Signed)
Addended by: Terence Lux B on: 10/05/2018 12:50 PM   Modules accepted: Orders

## 2018-10-07 ENCOUNTER — Other Ambulatory Visit: Payer: Self-pay | Admitting: Family Medicine

## 2018-11-03 ENCOUNTER — Other Ambulatory Visit: Payer: Self-pay | Admitting: Family Medicine

## 2019-01-10 ENCOUNTER — Encounter: Payer: Self-pay | Admitting: Family Medicine

## 2019-01-10 ENCOUNTER — Ambulatory Visit (INDEPENDENT_AMBULATORY_CARE_PROVIDER_SITE_OTHER): Payer: Managed Care, Other (non HMO) | Admitting: Family Medicine

## 2019-01-10 ENCOUNTER — Other Ambulatory Visit: Payer: Self-pay

## 2019-01-10 VITALS — BP 124/81 | HR 62 | Ht 72.0 in | Wt 247.0 lb

## 2019-01-10 DIAGNOSIS — E785 Hyperlipidemia, unspecified: Secondary | ICD-10-CM

## 2019-01-10 DIAGNOSIS — E119 Type 2 diabetes mellitus without complications: Secondary | ICD-10-CM | POA: Diagnosis not present

## 2019-01-10 DIAGNOSIS — I10 Essential (primary) hypertension: Secondary | ICD-10-CM

## 2019-01-10 DIAGNOSIS — E1169 Type 2 diabetes mellitus with other specified complication: Secondary | ICD-10-CM

## 2019-01-10 NOTE — Progress Notes (Signed)
Virtual Visit via Video   I connected with@ on 01/10/19 at  3:30 PM EDT by a video enabled telemedicine application and verified that I am speaking with the correct person using two identifiers. Location patient: Home Location provider: Acupuncturist, Office Persons participating in the virtual visit: pt and myself  I discussed the limitations of evaluation and management by telemedicine and the availability of in person appointments. The patient expressed understanding and agreed to proceed.  Subjective:   HPI:  HTN- chronic problem, on Lisinopril 10mg  daily w/ good control.  Denies CP, SOB, HAs, visual changes, edema.  Hyperlipidemia- chronic problem.  On Simvastatin 40mg  daily.  No abd pain, N/V.  DM- chronic problem.  Currently diet controlled.  UTD on eye exam.  On ACE for renal protection.  Due for foot exam but unable to perform at this time.  Pt is walking at least 3 miles daily.  No numbness/tingling of hands/feet.  ROS: See pertinent positives and negatives per HPI.  Patient Active Problem List   Diagnosis Date Noted  . 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 Skin tag 02/06/2015  . Erectile dysfunction associated with type 2 diabetes mellitus ([place]) 01/12/2015  . Eczema 06/18/2014  . Fungal dermatitis 06/18/2014  . Routine general medical examination at a health care facility 07/16/2012  . Neoplasm of uncertain behavior of skin 03/01/2012  . Diabetes mellitus, type 2 ([place]) 06/30/2011  . Essential hypertension 06/30/2011  . OTHER ANXIETY STATES 06/17/2010  . OTHER MALAISE AND FATIGUE 11/16/2009  . Morbid obesity ([place]) 09/07/2009  . GERD 11/03/2008  . Hyperlipidemia associated with type 2 diabetes mellitus ([place]) 07/28/2008  . OSTEOARTHRITIS 07/28/2008  . IMPOTENCE 07/25/2008  . ALCOHOL USE 07/25/2008  . 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 Smoking status: Former Smoker    Packs/day: 1.00    Years: 20.00    Pack years:  20.00    Last attempt to quit: 2008    Years since quitting: 12.2  . Smokeless tobacco: Never Used  Substance Use Topics  . Alcohol use: Yes    Alcohol/week: 15.0 standard drinks    Types: 15 Cans of beer per week    Comment: 15 beers per week per pt    Current Outpatient Medications:  .  aspirin 81 MG tablet, Take 81 mg by mouth daily.  , Disp: , Rfl:  .  fluticasone (FLONASE) 50 MCG/ACT nasal spray, USE TWO SPRAYS IN EACH NOSTRIL DAILY, Disp: 64 g, Rfl: 1 .  glucosamine-chondroitin 500-400 MG tablet, Take 1 tablet by mouth daily.  , Disp: , Rfl:  .  KRILL OIL PO, Take by mouth., Disp: , Rfl:  .  lisinopril (PRINIVIL,ZESTRIL) 10 MG tablet, TAKE 1 TABLET BY MOUTH DAILY, Disp: 30 tablet, Rfl: 6 .  meloxicam (MOBIC) 15 MG tablet, TAKE 1 TABLET(15 MG) BY MOUTH DAILY, Disp: 30 tablet, Rfl: 0 .  Multiple Vitamin (MULTIVITAMIN) tablet, Take 1 tablet by mouth daily.  , Disp: , Rfl:  .  pantoprazole (PROTONIX) 40 MG tablet, TAKE 1 TABLET BY MOUTH EVERY DAY, Disp: 90 tablet, Rfl: 0 .  simvastatin (ZOCOR) 40 MG tablet, TAKE 1 TABLET BY MOUTH DAILY, Disp: 30 tablet, Rfl: 6 .  vitamin B-12 (CYANOCOBALAMIN) 1000 MCG tablet, Take 1,000 mcg by mouth daily.  , Disp: , Rfl:  .  vitamin C (ASCORBIC ACID) 500 MG tablet, Take 500 mg by mouth daily.  , Disp: , Rfl:  .  amoxicillin (AMOXIL) 875 MG tablet, Take 1 tablet (875 mg total) by mouth 2 (two) times daily., Disp: 20 tablet, Rfl: 0 .  tiZANidine (ZANAFLEX) 4 MG tablet, Take 1 tablet (4 mg total) by mouth every 6 (six) hours as needed for muscle spasms., Disp: 15 tablet, Rfl: 0  No Known Allergies  Objective:   BP 124/81   Pulse 62   Ht 6' (1.829 m)   Wt 247 lb (112 kg)   BMI 33.50 kg/m  AAOx3, NAD NCAT, EOMI No obvious CN deficits Coloring WNL Pt is able to speak clearly, coherently without shortness of breath or increased work of breathing.  Thought process is linear.  Mood is appropriate.   Assessment and Plan:   HTN- chronic problem,  well controlled on Lisinopril.  Asymptomatic.  Pt declines labs.  Will continue to monitor.  Hyperlipidemia- chronic problem.  Tolerating statin w/o difficulty.  Applauded his efforts at regular exercise.  Declines labs.  Will plan to do them at upcoming visit  DM- chronic problem, diet controlled.  Asymptomatic.  UTD on eye exam, on ACE.  Unable to do foot exam via virtual visit.  Will do at upcoming appt.   Annye Asa, MD 01/10/2019

## 2019-01-10 NOTE — Progress Notes (Signed)
I have discussed the procedure for the virtual visit with the patient who has given consent to proceed with assessment and treatment.   BETHANY DILLARD, CMA     

## 2019-01-23 ENCOUNTER — Ambulatory Visit: Payer: Managed Care, Other (non HMO) | Admitting: Family Medicine

## 2019-02-26 ENCOUNTER — Encounter: Payer: Self-pay | Admitting: Family Medicine

## 2019-02-26 ENCOUNTER — Other Ambulatory Visit: Payer: Self-pay

## 2019-02-26 ENCOUNTER — Ambulatory Visit (INDEPENDENT_AMBULATORY_CARE_PROVIDER_SITE_OTHER): Payer: Managed Care, Other (non HMO) | Admitting: Family Medicine

## 2019-02-26 VITALS — BP 121/77 | HR 72 | Temp 97.7°F | Ht 72.0 in | Wt 242.0 lb

## 2019-02-26 DIAGNOSIS — M722 Plantar fascial fibromatosis: Secondary | ICD-10-CM | POA: Diagnosis not present

## 2019-02-26 DIAGNOSIS — M25561 Pain in right knee: Secondary | ICD-10-CM

## 2019-02-26 MED ORDER — NAPROXEN 500 MG PO TABS: 500.0000 mg | ORAL_TABLET | Freq: Two times a day (BID) | ORAL | 0 refills | Status: DC

## 2019-02-26 NOTE — Progress Notes (Signed)
I have discussed the procedure for the virtual visit with the patient who has given consent to proceed with assessment and treatment.   Glucose 154  Davis Gourd, CMA

## 2019-02-26 NOTE — Progress Notes (Signed)
Virtual Visit via Video   I connected with patient on 02/26/19 at 11:00 AM EDT by a video enabled telemedicine application and verified that I am speaking with the correct person using two identifiers.  Location patient: Home Location provider: Acupuncturist, Office Persons participating in the virtual visit: Patient, Provider, Carlos (Jess B)  I discussed the limitations of evaluation and management by telemedicine and the availability of in person appointments. The patient expressed understanding and agreed to proceed.  Subjective:   HPI:   Knee pain- pt reports ~1 week ago knee felt 'fat, stiff, and it just wasn't working right'.  R knee.  This weekend developed numbness of R foot.  Numbness is 'mainly in my heel'.  Pins and needles.  Wife told him that knee 'felt hot' last nice.  Iced w/ some relief.  No known injury.  Increased walking recently.  Knee is TTP.  No redness.  Increased beer intake w/ COVID.  Pt takes Aleve 'fairly regularly' and that helps somewhat.  Pain is anterior around both sides of kneecap.  No fevers or systemic sxs.  Just bought new sneakers and insoles.  ROS:   See pertinent positives and negatives per HPI.  Patient Active Problem List   Diagnosis Date Noted  . 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 Skin tag 02/06/2015  . Erectile dysfunction associated with type 2 diabetes mellitus ([place]) 01/12/2015  . Eczema 06/18/2014  . Fungal dermatitis 06/18/2014  . Routine general medical examination at a health care facility 07/16/2012  . Neoplasm of uncertain behavior of skin 03/01/2012  . Diabetes mellitus, type 2 ([place]) 06/30/2011  . Essential hypertension 06/30/2011  . OTHER ANXIETY STATES 06/17/2010  . OTHER MALAISE AND FATIGUE 11/16/2009  . Morbid obesity ([place]) 09/07/2009  . GERD 11/03/2008  . Hyperlipidemia associated with type 2 diabetes mellitus ([place]) 07/28/2008  . OSTEOARTHRITIS 07/28/2008  . IMPOTENCE 07/25/2008  . ALCOHOL USE 07/25/2008  .  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 Smoking status: Former Smoker    Packs/day: 1.00    Years: 20.00    Pack years: 20.00    Last attempt to quit: 2008    Years since quitting: 12.3  . Smokeless tobacco: Never Used  Substance Use Topics  . Alcohol use: Yes    Alcohol/week: 15.0 standard drinks    Types: 15 Cans of beer per week    Comment: 15 beers per week per pt    Current Outpatient Medications:  .  aspirin 81 MG tablet, Take 81 mg by mouth daily.  , Disp: , Rfl:  .  fluticasone (FLONASE) 50 MCG/ACT nasal spray, USE TWO SPRAYS IN EACH NOSTRIL DAILY, Disp: 64 g, Rfl: 1 .  glucosamine-chondroitin 500-400 MG tablet, Take 1 tablet by mouth daily.  , Disp: , Rfl:  .  KRILL OIL PO, Take by mouth., Disp: , Rfl:  .  lisinopril (PRINIVIL,ZESTRIL) 10 MG tablet, TAKE 1 TABLET BY MOUTH DAILY, Disp: 30 tablet, Rfl: 6 .  Multiple Vitamin (MULTIVITAMIN) tablet, Take 1 tablet by mouth daily.  , Disp: , Rfl:  .  pantoprazole (PROTONIX) 40 MG tablet, TAKE 1 TABLET BY MOUTH EVERY DAY, Disp: 90 tablet, Rfl: 0 .  simvastatin (ZOCOR) 40 MG tablet, TAKE 1 TABLET BY MOUTH DAILY, Disp: 30 tablet, Rfl: 6 .  vitamin B-12 (CYANOCOBALAMIN) 1000 MCG tablet, Take 1,000 mcg by mouth daily.  , Disp: , Rfl:  .  vitamin C (ASCORBIC  ACID) 500 MG tablet, Take 500 mg by mouth daily.  , Disp: , Rfl:  .  meloxicam (MOBIC) 15 MG tablet, TAKE 1 TABLET(15 MG) BY MOUTH DAILY (Patient not taking: Reported on 02/26/2019), Disp: 30 tablet, Rfl: 0 .  tiZANidine (ZANAFLEX) 4 MG tablet, Take 1 tablet (4 mg total) by mouth every 6 (six) hours as needed for muscle spasms. (Patient not taking: Reported on 02/26/2019), Disp: 15 tablet, Rfl: 0  No Known Allergies  Objective:   BP 121/77   Pulse 72   Temp 97.7 F (36.5 C) (Oral)   Ht 6' (1.829 m)   Wt 242 lb (109.8 kg)   BMI 32.82 kg/m   AAOx3, NAD NCAT, EOMI No obvious CN deficits Coloring WNL Pt is able to speak clearly, coherently  without shortness of breath or increased work of breathing.  Thought process is linear.  Mood is appropriate. R knee w/o redness or obvious swelling R foot w/o swelling, redness, or sores present.  TTP over insertion of R plantar fascia   Assessment and Plan:   R knee pain- new.  No evidence of gout or septic joint on hx or PE.  Suspect this is an overuse injury due to his increased walking and new shoes.  Start scheduled NSAIDs.  Discussed possibility of gout and septic joint w/ pt and provided red flags to watch for.  Pt to ice, elevate and let me know if things change.  Pt expressed understanding and is in agreement w/ plan.   Plantar fasciitis- new.  Start scheduled NSAIDs.  Reviewed icing and stretching w/ pt.  Reviewed supportive care and red flags that should prompt return.  Pt expressed understanding and is in agreement w/ plan.    Annye Asa, MD 02/26/2019

## 2019-03-25 ENCOUNTER — Ambulatory Visit: Payer: Managed Care, Other (non HMO) | Admitting: Family Medicine

## 2019-03-25 ENCOUNTER — Encounter: Payer: Self-pay | Admitting: Family Medicine

## 2019-03-25 ENCOUNTER — Other Ambulatory Visit: Payer: Self-pay | Admitting: Family Medicine

## 2019-03-25 ENCOUNTER — Other Ambulatory Visit: Payer: Self-pay

## 2019-03-25 VITALS — BP 125/78 | HR 58 | Temp 97.9°F | Resp 17 | Ht 72.0 in | Wt 246.0 lb

## 2019-03-25 DIAGNOSIS — Z125 Encounter for screening for malignant neoplasm of prostate: Secondary | ICD-10-CM | POA: Diagnosis not present

## 2019-03-25 DIAGNOSIS — M25562 Pain in left knee: Secondary | ICD-10-CM | POA: Diagnosis not present

## 2019-03-25 DIAGNOSIS — Z0000 Encounter for general adult medical examination without abnormal findings: Secondary | ICD-10-CM

## 2019-03-25 DIAGNOSIS — E119 Type 2 diabetes mellitus without complications: Secondary | ICD-10-CM

## 2019-03-25 DIAGNOSIS — Z0001 Encounter for general adult medical examination with abnormal findings: Secondary | ICD-10-CM

## 2019-03-25 LAB — CBC WITH DIFFERENTIAL/PLATELET
Basophils Absolute: 0.1 10*3/uL (ref 0.0–0.1)
Basophils Relative: 1.1 % (ref 0.0–3.0)
Eosinophils Absolute: 0.2 10*3/uL (ref 0.0–0.7)
Eosinophils Relative: 2.7 % (ref 0.0–5.0)
HCT: 44 % (ref 39.0–52.0)
Hemoglobin: 14.6 g/dL (ref 13.0–17.0)
Lymphocytes Relative: 29.2 % (ref 12.0–46.0)
Lymphs Abs: 1.7 10*3/uL (ref 0.7–4.0)
MCHC: 33.3 g/dL (ref 30.0–36.0)
MCV: 95.7 fl (ref 78.0–100.0)
Monocytes Absolute: 0.5 10*3/uL (ref 0.1–1.0)
Monocytes Relative: 8.9 % (ref 3.0–12.0)
Neutro Abs: 3.4 10*3/uL (ref 1.4–7.7)
Neutrophils Relative %: 58.1 % (ref 43.0–77.0)
Platelets: 257 10*3/uL (ref 150.0–400.0)
RBC: 4.6 Mil/uL (ref 4.22–5.81)
RDW: 13.6 % (ref 11.5–15.5)
WBC: 5.9 10*3/uL (ref 4.0–10.5)

## 2019-03-25 LAB — PSA: PSA: 2.45 ng/mL (ref 0.10–4.00)

## 2019-03-25 LAB — BASIC METABOLIC PANEL
BUN: 12 mg/dL (ref 6–23)
CO2: 30 mEq/L (ref 19–32)
Calcium: 9.4 mg/dL (ref 8.4–10.5)
Chloride: 101 mEq/L (ref 96–112)
Creatinine, Ser: 0.93 mg/dL (ref 0.40–1.50)
GFR: 81.6 mL/min (ref >60.00)
Glucose, Bld: 100 mg/dL — ABNORMAL HIGH (ref 70–99)
Potassium: 4.6 mEq/L (ref 3.5–5.1)
Sodium: 140 mEq/L (ref 135–145)

## 2019-03-25 LAB — LIPID PANEL
Cholesterol: 198 mg/dL (ref 0–200)
HDL: 71.5 mg/dL (ref >39.00)
LDL Cholesterol: 115 mg/dL — ABNORMAL HIGH (ref 0–99)
NonHDL: 126.75
Total CHOL/HDL Ratio: 3
Triglycerides: 60 mg/dL (ref 0.0–149.0)
VLDL: 12 mg/dL (ref 0.0–40.0)

## 2019-03-25 LAB — HEPATIC FUNCTION PANEL
ALT: 18 U/L (ref 0–53)
AST: 21 U/L (ref 0–37)
Albumin: 4.4 g/dL (ref 3.5–5.2)
Alkaline Phosphatase: 55 U/L (ref 39–117)
Bilirubin, Direct: 0.1 mg/dL (ref 0.0–0.3)
Total Bilirubin: 0.7 mg/dL (ref 0.2–1.2)
Total Protein: 6.8 g/dL (ref 6.0–8.3)

## 2019-03-25 LAB — TSH: TSH: 0.93 u[IU]/mL (ref 0.35–4.50)

## 2019-03-25 LAB — HEMOGLOBIN A1C: Hgb A1c MFr Bld: 6.6 % — ABNORMAL HIGH (ref 4.6–6.5)

## 2019-03-25 NOTE — Assessment & Plan Note (Signed)
Chronic problem.  UTD on eye exam.  Foot exam done today.  On ACE for renal protection.  Check labs.  Adjust meds prn

## 2019-03-25 NOTE — Assessment & Plan Note (Signed)
Pt's PE WNL w/ exception of obesity and mildly swollen R knee.  UTD on colonoscopy, immunizations.  Check labs.  Anticipatory guidance provided.

## 2019-03-25 NOTE — Patient Instructions (Signed)
Follow up in 3-4 months to recheck diabetes We'll notify you of your lab results and make any changes if needed We'll call you with your Ortho appt Continue to work on healthy diet and regular exercise- you can do it! Call with any questions or concerns Stay Safe!

## 2019-03-25 NOTE — Progress Notes (Signed)
   Subjective:    Patient ID: Tony Garza, male    DOB: [birth year], 63 y.o.   MRN: [id number]  HPI CPE- UTD on colonoscopy, eye exam, immunizations.  Due for foot exam.   Review of Systems Patient reports no vision/hearing changes, anorexia, fever ,adenopathy, persistant/recurrent hoarseness, swallowing issues, chest pain, palpitations, edema, persistant/recurrent cough, hemoptysis, dyspnea (rest,exertional, paroxysmal nocturnal), gastrointestinal  bleeding (melena, rectal bleeding), abdominal pain, excessive heart burn, GU symptoms (dysuria, hematuria, voiding/incontinence issues) syncope, focal weakness, memory loss, skin/hair/nail changes, depression, anxiety, abnormal bruising/bleeding.   + R knee pain- no known injury.  No relief w/ Naproxen.  Rather than going to the gym he is walking 3-5 miles/day.  Pt reports knee is 'stiff and feels thick'.  Pain is worse going downhill or down steps.  + numbness of R foot    Objective:   Physical Exam General Appearance:    Alert, cooperative, no distress, appears stated age  Head:    Normocephalic, without obvious abnormality, atraumatic  Eyes:    PERRL, conjunctiva/corneas clear, EOM's intact, fundi    benign, both eyes       Ears:    Normal TM's and external ear canals, both ears  Nose:   Nares normal, septum midline, mucosa normal, no drainage   or sinus tenderness  Throat:   Lips, mucosa, and tongue normal; teeth and gums normal  Neck:   Supple, symmetrical, trachea midline, no adenopathy;       thyroid:  No enlargement/tenderness/nodules  Back:     Symmetric, no curvature, ROM normal, no CVA tenderness  Lungs:     Clear to auscultation bilaterally, respirations unlabored  Chest wall:    No tenderness or deformity  Heart:    Regular rate and rhythm, S1 and S2 normal, no murmur, rub   or gallop  Abdomen:     Soft, non-tender, bowel sounds active all four quadrants,    no masses, no organomegaly  Genitalia:    Deferred at pt's  request  Rectal:    Extremities:   Extremities normal, atraumatic, mild R knee swelling.  Pain w/ flexion but not extension  Pulses:   2+ and symmetric all extremities  Skin:   Skin color, texture, turgor normal, no rashes or lesions  Lymph nodes:   Cervical, supraclavicular, and axillary nodes normal  Neurologic:   CNII-XII intact. Normal strength, sensation and reflexes      throughout          Assessment & Plan:  R knee pain- no improvement w/ scheduled NSAIDs.  Given pain, mild swelling, and difficulty walking down steps/hill will refer to ortho.  Concern for meniscus injury.  Pt expressed understanding and is in agreement w/ plan.

## 2019-04-26 ENCOUNTER — Other Ambulatory Visit: Payer: Self-pay | Admitting: Family Medicine

## 2019-04-26 NOTE — Telephone Encounter (Signed)
Last OV 03/25/19 Naproxen last filled 03/25/19 #60 with 0

## 2019-06-15 ENCOUNTER — Other Ambulatory Visit: Payer: Self-pay | Admitting: Family Medicine

## 2019-06-24 ENCOUNTER — Other Ambulatory Visit: Payer: Self-pay | Admitting: General Practice

## 2019-06-24 MED ORDER — LISINOPRIL 10 MG PO TABS: 10.0000 mg | ORAL_TABLET | Freq: Every day | ORAL | 1 refills | Status: DC

## 2019-07-22 ENCOUNTER — Other Ambulatory Visit: Payer: Self-pay | Admitting: General Practice

## 2019-07-22 MED ORDER — FLUTICASONE PROPIONATE 50 MCG/ACT NA SUSP: 2.0000 | Freq: Every day | NASAL | 1 refills | Status: DC

## 2019-08-08 ENCOUNTER — Other Ambulatory Visit: Payer: Self-pay

## 2019-08-08 ENCOUNTER — Ambulatory Visit (INDEPENDENT_AMBULATORY_CARE_PROVIDER_SITE_OTHER): Payer: Managed Care, Other (non HMO)

## 2019-08-08 DIAGNOSIS — Z23 Encounter for immunization: Secondary | ICD-10-CM | POA: Diagnosis not present

## 2019-10-08 ENCOUNTER — Telehealth: Payer: Managed Care, Other (non HMO) | Admitting: Physician Assistant

## 2019-10-08 DIAGNOSIS — M545 Low back pain, unspecified: Secondary | ICD-10-CM

## 2019-10-08 DIAGNOSIS — R1032 Left lower quadrant pain: Secondary | ICD-10-CM

## 2019-10-08 NOTE — Progress Notes (Signed)
Based on what you shared with me, I feel your condition warrants further evaluation and I recommend that you be seen for a face to face office visit.  Pain in the left lower portion of the abdomen radiating to the back could indicate a number of different things. You could have a kidney stone or a condition known as diverticulitis, but I cannot evaluate for either condition virtually. I would recommend evaluation at an urgent care, your PCP, or an emergency department today for further testing and evaluation. They may want to obtain bloodwork, a urine sample, and possibly imaging of some sort.     NOTE: If you entered your credit card information for this eVisit, you will not be charged. You may see a "hold" on your card for the $35 but that hold will drop off and you will not have a charge processed.   If you are having a true medical emergency please call 911.      For an urgent face to face visit, [facility] has five urgent care centers for your convenience:      NEW:  [facility] [place] [facility] [place] Get Driving Directions [id number] [address] [place], [place] [address] . 10 am - 6pm Monday - Friday    [place] [facility]) Get Driving Directions [id number] [address] [place], [place] [address] . 10 am to 8 pm Monday-Friday . 12 pm to 8 pm Mid - [facility] at [facility] Get Driving Directions [id number] [place], [place], [place] [address] . 8 am to 8 pm Monday-Friday . 9 am to 6 pm Saturday . 11 am to 6 pm Sunday     [facility] Get Driving Directions  [id number] [address] [place], [place] [address] . 8 am to 8 pm Monday-Friday . 8 am to 4 pm [facility] [place] Get Driving Directions [id number] [address] [place],  [address] . 12 pm to 6 pm  Monday-Friday      Your e-visit answers were reviewed by a board certified advanced clinical practitioner to complete your personal care plan.  Thank you for using e-Visits.   Approximately 5 minutes of time have been spent researching, coordinating, and implementing care for this patient today.

## 2019-10-09 ENCOUNTER — Encounter: Payer: Self-pay | Admitting: Physician Assistant

## 2019-10-09 ENCOUNTER — Ambulatory Visit (INDEPENDENT_AMBULATORY_CARE_PROVIDER_SITE_OTHER): Payer: Managed Care, Other (non HMO) | Admitting: Physician Assistant

## 2019-10-09 ENCOUNTER — Other Ambulatory Visit: Payer: Self-pay

## 2019-10-09 VITALS — BP 130/78 | HR 68 | Temp 98.7°F | Resp 16 | Ht 73.0 in | Wt 256.0 lb

## 2019-10-09 DIAGNOSIS — K5792 Diverticulitis of intestine, part unspecified, without perforation or abscess without bleeding: Secondary | ICD-10-CM | POA: Diagnosis not present

## 2019-10-09 LAB — POCT URINALYSIS DIPSTICK
Bilirubin, UA: NEGATIVE
Blood, UA: NEGATIVE
Glucose, UA: NEGATIVE
Ketones, UA: NEGATIVE
Leukocytes, UA: NEGATIVE
Nitrite, UA: NEGATIVE
Protein, UA: NEGATIVE
Spec Grav, UA: 1.01 (ref 1.010–1.025)
Urobilinogen, UA: 0.2 E.U./dL
pH, UA: 6 (ref 5.0–8.0)

## 2019-10-09 MED ORDER — AMOXICILLIN-POT CLAVULANATE 875-125 MG PO TABS: 1.0000 | ORAL_TABLET | Freq: Two times a day (BID) | ORAL | 0 refills | Status: DC

## 2019-10-09 NOTE — Progress Notes (Signed)
Patient presents to clinic today c/o 2 days of left lower quadrant pain.  Patient notes symptoms started while he was driving.  Notice mild aching in left lower quadrant.  Notes by the time he reached his destination pain was quite significant.  Noted tenderness with pressing on the left lower abdomen.  Patient denies fever, chills, nausea or vomiting.  Denies change in appetite.  Denies any significant fluctuations in bowel habits.  Denies melena or hematochezia.  Denies change in urinary habits.  Patient denies trauma, injury.  Denies any episodes of heavy lifting.  Pain does seem a bit better when laying down.  Patient has history of umbilical hernia, but denies noting any mass or bulge in the area of concern.  Patient also with history of diverticulosis, noted on colonoscopy in 2018.  Denies history of acute diverticulitis.  Patient also denies history of nephrolithiasis.  Patient endorses that pain is significantly improved today compared to yesterday.  Still having some tender with palpation on the area but notes is quite better.  Past Medical History:  Diagnosis Date  . Anxiety   . Arthritis    osteoarthritis  . Cancer [facility])    Skin cancer  . Chicken pox   . Colon polyp   . Diabetes mellitus, type 2 ([place]) 06/30/2011   controlled with diet/no meds. per pt.  . ED (erectile dysfunction)   . GERD (gastroesophageal reflux disease)   . Hyperlipidemia   . Obesity   . Peyronie disease   . Unspecified essential hypertension 06/30/2011    Current Outpatient Medications on File Prior to Visit  Medication Sig Dispense Refill  . aspirin 81 MG tablet Take 81 mg by mouth daily.      . fluticasone (FLONASE) 50 MCG/ACT nasal spray Place 2 sprays into both nostrils daily. 64 g 1  . glucosamine-chondroitin 500-400 MG tablet Take 1 tablet by mouth daily.      Marland Kitchen KRILL OIL PO Take by mouth.    Marland Kitchen lisinopril (ZESTRIL) 10 MG tablet Take 1 tablet (10 mg total) by mouth daily. 90 tablet 1  . Multiple  Vitamin (MULTIVITAMIN) tablet Take 1 tablet by mouth daily.      . naproxen (NAPROSYN) 500 MG tablet TAKE 1 TABLET(500 MG) BY MOUTH TWICE DAILY WITH A MEAL 60 tablet 0  . pantoprazole (PROTONIX) 40 MG tablet TAKE 1 TABLET BY MOUTH EVERY DAY 90 tablet 0  . simvastatin (ZOCOR) 40 MG tablet TAKE 1 TABLET BY MOUTH DAILY 30 tablet 6  . vitamin B-12 (CYANOCOBALAMIN) 1000 MCG tablet Take 1,000 mcg by mouth daily.      . vitamin C (ASCORBIC ACID) 500 MG tablet Take 500 mg by mouth daily.       No current facility-administered medications on file prior to visit.    No Known Allergies  Family History  Problem Relation Age of Onset  . Cancer Father        lymphoma  . Early death Father   . Diabetes Mother   . Cancer Mother        Lung?  . Colon cancer Neg Hx   . Colon polyps Neg Hx   . Esophageal cancer Neg Hx   . Rectal cancer Neg Hx   . Stomach cancer Neg Hx     Social History   Socioeconomic History  . Marital status: Married    Spouse name: Not on file  . Number of children: Not on file  . Years of education: Not on  file  . Highest education level: Not on file  Occupational History  . Occupation: Economist: Sports administrator  Tobacco Use  . Smoking status: Former Smoker    Packs/day: 1.00    Years: 20.00    Pack years: 20.00    Quit date: 2008    Years since quitting: 13.0  . Smokeless tobacco: Never Used  Substance and Sexual Activity  . Alcohol use: Yes    Alcohol/week: 15.0 standard drinks    Types: 15 Cans of beer per week    Comment: 15 beers per week per pt  . Drug use: No  . Sexual activity: Not on file  Other Topics Concern  . Not on file  Social History Narrative   Regular exercise--no   Social Determinants of Health   Financial Resource Strain:   . Difficulty of Paying Living Expenses: Not on file  Food Insecurity:   . Worried About Charity fundraiser in the Last Year: Not on file  . Ran Out of Food in the Last Year: Not on file    Transportation Needs:   . Lack of Transportation (Medical): Not on file  . Lack of Transportation (Non-Medical): Not on file  Physical Activity:   . Days of Exercise per Week: Not on file  . Minutes of Exercise per Session: Not on file  Stress:   . Feeling of Stress : Not on file  Social Connections:   . Frequency of Communication with Friends and Family: Not on file  . Frequency of Social Gatherings with Friends and Family: Not on file  . Attends Religious Services: Not on file  . Active Member of Clubs or Organizations: Not on file  . Attends Archivist Meetings: Not on file  . Marital Status: Not on file   Review of Systems - See HPI.  All other ROS are negative.  Wt 256 lb (116.1 kg)   BMI 34.72 kg/m   Physical Exam Vitals reviewed.  Constitutional:      Appearance: He is well-developed. He is not ill-appearing.  HENT:     Head: Normocephalic and atraumatic.  Cardiovascular:     Rate and Rhythm: Normal rate and regular rhythm.     Heart sounds: Normal heart sounds.  Pulmonary:     Effort: Pulmonary effort is normal.  Abdominal:     General: Abdomen is flat. Bowel sounds are normal.     Palpations: Abdomen is soft.     Tenderness: There is abdominal tenderness in the left lower quadrant. There is no right CVA tenderness, left CVA tenderness, guarding or rebound.     Hernia: A hernia is present. Hernia is present in the ventral area (Chronic.  Unchanged per patient.  Patient also with diastases recti.). There is no hernia in the left inguinal area or left femoral area.  Neurological:     Mental Status: He is alert.    Assessment/Plan: 1. Diverticulitis Suspect mild flare of diverticular disease.  Urine dip in office unremarkable.  Stone disease unlikely.  Range of motion in office does not seem to exacerbate symptoms.  Pain with deep palpation in left lower quadrant and patient with confirmed sigmoid diverticuli on colonoscopy.  Symptoms seem to be improving  since he made significant changes with his diet.  Is keeping well hydrated and following a bland diet.  Given lack of alarm signs or symptoms and history of noted diverticuli on prior colonoscopy, do not feel imaging is warranted at  present.  Encouraged him to continue with this and watch symptoms over the rest of the day.  If not continuing to resolve, will have him start antibiotic.  Rx Augmentin for mild diverticular flare.  Want to avoid Cipro if at all possible, especially given mild symptoms.  - POCT Urinalysis Dipstick - amoxicillin-clavulanate (AUGMENTIN) 875-125 MG tablet; Take 1 tablet by mouth 2 (two) times daily.  Dispense: 14 tablet; Refill: 0    [name], PA-C

## 2019-10-09 NOTE — Patient Instructions (Signed)
Please keep well-hydrated and continue bland diet.  Since symptoms are significantly improved from yesterday, I am hoping inflammation is calming down on its own since you switched to a bland diet. Tylenol for pain. If you note symptoms are not continuing to improve/resolving over the next 24 hours or if anything worsens, take the antibiotic as directed.    Diverticulitis  Diverticulitis is when small pockets in your large intestine (colon) get infected or swollen. This causes stomach pain and watery poop (diarrhea). These pouches are called diverticula. They form in people who have a condition called diverticulosis. Follow these instructions at home: Medicines  Take over-the-counter and prescription medicines only as told by your doctor. These include: ? Antibiotics. ? Pain medicines. ? Fiber pills. ? Probiotics. ? Stool softeners.  Do not drive or use heavy machinery while taking prescription pain medicine.  If you were prescribed an antibiotic, take it as told. Do not stop taking it even if you feel better. General instructions   Follow a diet as told by your doctor.  When you feel better, your doctor may tell you to change your diet. You may need to eat a lot of fiber. Fiber makes it easier to poop (have bowel movements). Healthy foods with fiber include: ? Berries. ? Beans. ? Lentils. ? Green vegetables.  Exercise 3 or more times a week. Aim for 30 minutes each time. Exercise enough to sweat and make your heart beat faster.  Keep all follow-up visits as told. This is important. You may need to have an exam of the large intestine. This is called a colonoscopy. Contact a doctor if:  Your pain does not get better.  You have a hard time eating or drinking.  You are not pooping like normal. Get help right away if:  Your pain gets worse.  Your problems do not get better.  Your problems get worse very fast.  You have a fever.  You throw up (vomit) more than one  time.  You have poop that is: ? Bloody. ? Black. ? Tarry. Summary  Diverticulitis is when small pockets in your large intestine (colon) get infected or swollen.  Take medicines only as told by your doctor.  Follow a diet as told by your doctor. This information is not intended to replace advice given to you by your health care provider. Make sure you discuss any questions you have with your health care provider. Document Released: 03/14/2008 Document Revised: 09/08/2017 Document Reviewed: 10/13/2016 Elsevier Patient Education  2020 [employer].

## 2019-10-10 ENCOUNTER — Ambulatory Visit: Payer: Managed Care, Other (non HMO) | Admitting: Physician Assistant

## 2020-01-08 ENCOUNTER — Other Ambulatory Visit: Payer: Self-pay | Admitting: Family Medicine

## 2020-01-08 DIAGNOSIS — E1169 Type 2 diabetes mellitus with other specified complication: Secondary | ICD-10-CM

## 2020-01-08 DIAGNOSIS — K219 Gastro-esophageal reflux disease without esophagitis: Secondary | ICD-10-CM

## 2020-01-08 DIAGNOSIS — E785 Hyperlipidemia, unspecified: Secondary | ICD-10-CM

## 2020-01-08 DIAGNOSIS — I10 Essential (primary) hypertension: Secondary | ICD-10-CM

## 2020-01-08 MED ORDER — LISINOPRIL 10 MG PO TABS: 10.0000 mg | ORAL_TABLET | Freq: Every day | ORAL | 0 refills | Status: DC

## 2020-01-08 MED ORDER — SIMVASTATIN 40 MG PO TABS: 40.0000 mg | ORAL_TABLET | Freq: Every day | ORAL | 0 refills | Status: DC

## 2020-01-08 MED ORDER — PANTOPRAZOLE SODIUM 40 MG PO TBEC: 40.0000 mg | DELAYED_RELEASE_TABLET | Freq: Every day | ORAL | 0 refills | Status: DC

## 2020-01-08 NOTE — Telephone Encounter (Signed)
Pt called in asking for refills on the lisinopril and the simvastatin in 90 day supply due to insurance. He uses [facility] on [address].

## 2020-02-10 ENCOUNTER — Telehealth: Payer: Self-pay | Admitting: Family Medicine

## 2020-02-10 DIAGNOSIS — K219 Gastro-esophageal reflux disease without esophagitis: Secondary | ICD-10-CM

## 2020-02-10 MED ORDER — PANTOPRAZOLE SODIUM 40 MG PO TBEC: 40.0000 mg | DELAYED_RELEASE_TABLET | Freq: Every day | ORAL | 0 refills | Status: DC

## 2020-02-10 NOTE — Telephone Encounter (Signed)
Pt called in stating that [facility] now uses [facility]. Can we send the protonix into them and update his pharmacy. Pt can be reached at the home #

## 2020-02-10 NOTE — Telephone Encounter (Signed)
Medication filled to pharmacy as requested.   

## 2020-02-10 NOTE — Addendum Note (Signed)
Addended by: Davis Gourd on: 02/10/2020 02:16 PM   Modules accepted: Orders

## 2020-02-13 ENCOUNTER — Other Ambulatory Visit: Payer: Self-pay | Admitting: General Practice

## 2020-02-13 DIAGNOSIS — I10 Essential (primary) hypertension: Secondary | ICD-10-CM

## 2020-02-13 DIAGNOSIS — E1169 Type 2 diabetes mellitus with other specified complication: Secondary | ICD-10-CM

## 2020-02-13 DIAGNOSIS — E785 Hyperlipidemia, unspecified: Secondary | ICD-10-CM

## 2020-02-13 MED ORDER — LISINOPRIL 10 MG PO TABS: 10.0000 mg | ORAL_TABLET | Freq: Every day | ORAL | 0 refills | Status: DC

## 2020-02-13 MED ORDER — SIMVASTATIN 40 MG PO TABS: 40.0000 mg | ORAL_TABLET | Freq: Every day | ORAL | 0 refills | Status: DC

## 2020-03-27 ENCOUNTER — Other Ambulatory Visit: Payer: Self-pay | Admitting: General Practice

## 2020-03-27 ENCOUNTER — Other Ambulatory Visit: Payer: Self-pay

## 2020-03-27 ENCOUNTER — Ambulatory Visit (INDEPENDENT_AMBULATORY_CARE_PROVIDER_SITE_OTHER): Payer: Managed Care, Other (non HMO) | Admitting: Family Medicine

## 2020-03-27 ENCOUNTER — Encounter: Payer: Self-pay | Admitting: Family Medicine

## 2020-03-27 VITALS — BP 126/84 | HR 82 | Temp 97.9°F | Resp 16 | Ht 72.0 in | Wt 258.4 lb

## 2020-03-27 DIAGNOSIS — E1169 Type 2 diabetes mellitus with other specified complication: Secondary | ICD-10-CM | POA: Diagnosis not present

## 2020-03-27 DIAGNOSIS — E119 Type 2 diabetes mellitus without complications: Secondary | ICD-10-CM | POA: Diagnosis not present

## 2020-03-27 DIAGNOSIS — Z0000 Encounter for general adult medical examination without abnormal findings: Secondary | ICD-10-CM

## 2020-03-27 DIAGNOSIS — Z125 Encounter for screening for malignant neoplasm of prostate: Secondary | ICD-10-CM | POA: Diagnosis not present

## 2020-03-27 DIAGNOSIS — E785 Hyperlipidemia, unspecified: Secondary | ICD-10-CM

## 2020-03-27 DIAGNOSIS — K439 Ventral hernia without obstruction or gangrene: Secondary | ICD-10-CM

## 2020-03-27 LAB — PSA: PSA: 1.71 ng/mL (ref 0.10–4.00)

## 2020-03-27 LAB — BASIC METABOLIC PANEL
BUN: 12 mg/dL (ref 6–23)
CO2: 31 mEq/L (ref 19–32)
Calcium: 9.4 mg/dL (ref 8.4–10.5)
Chloride: 103 mEq/L (ref 96–112)
Creatinine, Ser: 1 mg/dL (ref 0.40–1.50)
GFR: 74.81 mL/min (ref >60.00)
Glucose, Bld: 136 mg/dL — ABNORMAL HIGH (ref 70–99)
Potassium: 4.4 mEq/L (ref 3.5–5.1)
Sodium: 139 mEq/L (ref 135–145)

## 2020-03-27 LAB — CBC WITH DIFFERENTIAL/PLATELET
Basophils Absolute: 0 10*3/uL (ref 0.0–0.1)
Basophils Relative: 0.9 % (ref 0.0–3.0)
Eosinophils Absolute: 0.2 10*3/uL (ref 0.0–0.7)
Eosinophils Relative: 3.3 % (ref 0.0–5.0)
HCT: 41.8 % (ref 39.0–52.0)
Hemoglobin: 14.2 g/dL (ref 13.0–17.0)
Lymphocytes Relative: 30.1 % (ref 12.0–46.0)
Lymphs Abs: 1.6 10*3/uL (ref 0.7–4.0)
MCHC: 34 g/dL (ref 30.0–36.0)
MCV: 95.4 fl (ref 78.0–100.0)
Monocytes Absolute: 0.5 10*3/uL (ref 0.1–1.0)
Monocytes Relative: 10 % (ref 3.0–12.0)
Neutro Abs: 3 10*3/uL (ref 1.4–7.7)
Neutrophils Relative %: 55.7 % (ref 43.0–77.0)
Platelets: 231 10*3/uL (ref 150.0–400.0)
RBC: 4.38 Mil/uL (ref 4.22–5.81)
RDW: 13.3 % (ref 11.5–15.5)
WBC: 5.5 10*3/uL (ref 4.0–10.5)

## 2020-03-27 LAB — LIPID PANEL
Cholesterol: 219 mg/dL — ABNORMAL HIGH (ref 0–200)
HDL: 80.4 mg/dL (ref >39.00)
LDL Cholesterol: 126 mg/dL — ABNORMAL HIGH (ref 0–99)
NonHDL: 138.98
Total CHOL/HDL Ratio: 3
Triglycerides: 67 mg/dL (ref 0.0–149.0)
VLDL: 13.4 mg/dL (ref 0.0–40.0)

## 2020-03-27 LAB — HEPATIC FUNCTION PANEL
ALT: 18 U/L (ref 0–53)
AST: 19 U/L (ref 0–37)
Albumin: 4.5 g/dL (ref 3.5–5.2)
Alkaline Phosphatase: 61 U/L (ref 39–117)
Bilirubin, Direct: 0.1 mg/dL (ref 0.0–0.3)
Total Bilirubin: 0.6 mg/dL (ref 0.2–1.2)
Total Protein: 7.1 g/dL (ref 6.0–8.3)

## 2020-03-27 LAB — HEMOGLOBIN A1C: Hgb A1c MFr Bld: 6.8 % — ABNORMAL HIGH (ref 4.6–6.5)

## 2020-03-27 LAB — TSH: TSH: 1.28 u[IU]/mL (ref 0.35–4.50)

## 2020-03-27 MED ORDER — ROSUVASTATIN CALCIUM 20 MG PO TABS
20.0000 mg | ORAL_TABLET | Freq: Every day | ORAL | 1 refills | Status: DC
Start: 2020-03-27 — End: 2020-04-06

## 2020-03-27 NOTE — Assessment & Plan Note (Signed)
Chronic problem.  Attempting to control w/ diet and exercise but pt has gained 12 lbs since last visit.  UTD on eye exam.  Foot exam done today.  On ACE for renal protection.  Check labs.  Adjust meds prn

## 2020-03-27 NOTE — Patient Instructions (Signed)
Follow up in 6 months to recheck sugar, BP, and cholesterol We'll notify you of your lab results and make any changes if needed Continue to work on healthy diet and regular exercise- you can do it! We'll call you with your surgery appt to discuss the hernia Call with any questions or concerns HAPPY ANNIVERSARY!!!

## 2020-03-27 NOTE — Assessment & Plan Note (Signed)
Deteriorated.  Pt has gained 12 lbs since last visit.  Has DM, HTN, Hyperlipidemia associated with obesity.  Stressed need for healthy diet and regular exercise.  Will follow.

## 2020-03-27 NOTE — Assessment & Plan Note (Signed)
Chronic problem.  Tolerating statin w/o difficulty.  Check labs.  Adjust meds prn  

## 2020-03-27 NOTE — Progress Notes (Signed)
   Subjective:    Patient ID: Tony Garza, male    DOB: 09-15-1956, 64 y.o.   MRN: [id number]  HPI CPE- UTD on colonoscopy, Tdap, flu, COVID.  Pt has gained 12 lbs since last visit.  Pt is going to [employer] regularly- cardio and weights   Review of Systems Patient reports no vision/hearing changes, anorexia, fever ,adenopathy, persistant/recurrent hoarseness, swallowing issues, chest pain, palpitations, edema, persistant/recurrent cough, hemoptysis, dyspnea (rest,exertional, paroxysmal nocturnal), gastrointestinal  bleeding (melena, rectal bleeding), abdominal pain, excessive heart burn, GU symptoms (dysuria, hematuria, voiding/incontinence issues) syncope, focal weakness, memory loss, numbness & tingling, skin/hair/nail changes, depression, anxiety, abnormal bruising/bleeding, musculoskeletal symptoms/signs.   This visit occurred during the SARS-CoV-2 public health emergency.  Safety protocols were in place, including screening questions prior to the visit, additional usage of staff PPE, and extensive cleaning of exam room while observing appropriate contact time as indicated for disinfecting solutions.       Objective:   Physical Exam General Appearance:    Alert, cooperative, no distress, appears stated age  Head:    Normocephalic, without obvious abnormality, atraumatic  Eyes:    PERRL, conjunctiva/corneas clear, EOM's intact, fundi    benign, both eyes       Ears:    Normal TM's and external ear canals, both ears  Nose:   Deferred due to COVID  Throat:   Neck:   Supple, symmetrical, trachea midline, no adenopathy;       thyroid:  No enlargement/tenderness/nodules  Back:     Symmetric, no curvature, ROM normal, no CVA tenderness  Lungs:     Clear to auscultation bilaterally, respirations unlabored  Chest wall:    No tenderness or deformity  Heart:    Regular rate and rhythm, S1 and S2 normal, no murmur, rub   or gallop  Abdomen:     Soft, non-tender, bowel sounds active all  four quadrants,    + ventral hernia  Genitalia:    deferred  Rectal:    Extremities:   Extremities normal, atraumatic, no cyanosis or edema  Pulses:   2+ and symmetric all extremities  Skin:   Skin color, texture, turgor normal, no rashes or lesions  Lymph nodes:   Cervical, supraclavicular, and axillary nodes normal  Neurologic:   CNII-XII intact. Normal strength, sensation and reflexes      throughout          Assessment & Plan:

## 2020-03-27 NOTE — Assessment & Plan Note (Signed)
Pt's PE WNL w/ exception of obesity and known ventral hernia.  UTD on colonoscopy, immunizations.  Check labs.  Anticipatory guidance provided.

## 2020-04-06 ENCOUNTER — Telehealth: Payer: Self-pay | Admitting: Family Medicine

## 2020-04-06 MED ORDER — ROSUVASTATIN CALCIUM 20 MG PO TABS: 20.0000 mg | ORAL_TABLET | Freq: Every day | ORAL | 1 refills | Status: DC

## 2020-04-06 NOTE — Telephone Encounter (Signed)
Medication filled to pharmacy as requested.   

## 2020-04-06 NOTE — Telephone Encounter (Signed)
Patient states that his Crestor needs to be sent in to [facility] - His regular pharmacy will not take a 90 day prescription and his insurance will only pay for the prescription if it is 90 days worth.

## 2020-05-18 ENCOUNTER — Other Ambulatory Visit: Payer: Self-pay | Admitting: Surgery

## 2020-05-28 ENCOUNTER — Other Ambulatory Visit: Payer: Self-pay | Admitting: Family Medicine

## 2020-05-28 DIAGNOSIS — I10 Essential (primary) hypertension: Secondary | ICD-10-CM

## 2020-06-09 ENCOUNTER — Other Ambulatory Visit: Payer: Self-pay | Admitting: Family Medicine

## 2020-06-09 DIAGNOSIS — K219 Gastro-esophageal reflux disease without esophagitis: Secondary | ICD-10-CM

## 2020-06-09 MED ORDER — PANTOPRAZOLE SODIUM 40 MG PO TBEC: 40.0000 mg | DELAYED_RELEASE_TABLET | Freq: Every day | ORAL | 0 refills | Status: DC

## 2020-07-06 ENCOUNTER — Other Ambulatory Visit: Payer: Self-pay | Admitting: General Practice

## 2020-07-06 ENCOUNTER — Encounter: Payer: Self-pay | Admitting: Family Medicine

## 2020-07-06 DIAGNOSIS — I10 Essential (primary) hypertension: Secondary | ICD-10-CM

## 2020-07-06 MED ORDER — LISINOPRIL 10 MG PO TABS: 10.0000 mg | ORAL_TABLET | Freq: Every day | ORAL | 1 refills | Status: DC

## 2020-07-09 ENCOUNTER — Other Ambulatory Visit: Payer: Self-pay

## 2020-07-09 ENCOUNTER — Ambulatory Visit (INDEPENDENT_AMBULATORY_CARE_PROVIDER_SITE_OTHER): Payer: Managed Care, Other (non HMO)

## 2020-07-09 DIAGNOSIS — Z23 Encounter for immunization: Secondary | ICD-10-CM

## 2020-08-04 ENCOUNTER — Encounter: Payer: Self-pay | Admitting: Family Medicine

## 2020-09-15 ENCOUNTER — Other Ambulatory Visit: Payer: Self-pay | Admitting: Family Medicine

## 2020-09-25 ENCOUNTER — Other Ambulatory Visit: Payer: Self-pay

## 2020-09-25 ENCOUNTER — Telehealth: Payer: Self-pay

## 2020-09-25 ENCOUNTER — Encounter: Payer: Self-pay | Admitting: Family Medicine

## 2020-09-25 ENCOUNTER — Other Ambulatory Visit (INDEPENDENT_AMBULATORY_CARE_PROVIDER_SITE_OTHER): Payer: Managed Care, Other (non HMO)

## 2020-09-25 ENCOUNTER — Ambulatory Visit: Payer: Managed Care, Other (non HMO) | Admitting: Family Medicine

## 2020-09-25 ENCOUNTER — Other Ambulatory Visit: Payer: Self-pay | Admitting: Family Medicine

## 2020-09-25 VITALS — BP 128/75 | HR 98 | Temp 97.3°F | Resp 17 | Ht 72.0 in | Wt 258.0 lb

## 2020-09-25 DIAGNOSIS — E1169 Type 2 diabetes mellitus with other specified complication: Secondary | ICD-10-CM

## 2020-09-25 DIAGNOSIS — I10 Essential (primary) hypertension: Secondary | ICD-10-CM

## 2020-09-25 DIAGNOSIS — E119 Type 2 diabetes mellitus without complications: Secondary | ICD-10-CM

## 2020-09-25 DIAGNOSIS — E785 Hyperlipidemia, unspecified: Secondary | ICD-10-CM

## 2020-09-25 DIAGNOSIS — K219 Gastro-esophageal reflux disease without esophagitis: Secondary | ICD-10-CM

## 2020-09-25 MED ORDER — PANTOPRAZOLE SODIUM 40 MG PO TBEC: 40.0000 mg | DELAYED_RELEASE_TABLET | Freq: Every day | ORAL | 1 refills | Status: DC

## 2020-09-25 NOTE — Assessment & Plan Note (Signed)
Chronic problem.  On Crestor 20mg  daily.  Check labs.  Adjust meds prn

## 2020-09-25 NOTE — Addendum Note (Signed)
Addended by: Kelle Darting A on: 09/25/2020 02:58 PM   Modules accepted: Orders

## 2020-09-25 NOTE — Patient Instructions (Addendum)
Schedule your complete physical in 6 months Please schedule a lab visit at your convenience (location of your choice) or go to [place] for labs (no appt needed)  We'll notify you of your lab results and make any changes if needed Continue to work on healthy diet and regular exercise- you can do it! Call Dr Levell July office about your hand [phone] Call with any questions or concerns Stay Safe!  Stay Healthy! Happy Holidays!

## 2020-09-25 NOTE — Addendum Note (Signed)
Addended by: Kelle Darting A on: 09/25/2020 02:57 PM   Modules accepted: Orders

## 2020-09-25 NOTE — Telephone Encounter (Signed)
error 

## 2020-09-25 NOTE — Assessment & Plan Note (Signed)
Chronic problem, well controlled on Lisinopril 10mg  daily.  Asymptomatic.  Check labs.  No anticipated med changes.

## 2020-09-25 NOTE — Assessment & Plan Note (Signed)
Chronic problem.  Currently diet controlled.  UTD on eye exam, foot exam, and on ACE for renal protection.  Encouraged healthy diet and regular exercise.  Check labs and determine if medication is needed.

## 2020-09-25 NOTE — Progress Notes (Signed)
° °  Subjective:    Patient ID: Tony Garza, male    DOB: [birth year], 64 y.o.   MRN: [id number]  HPI DM- chronic problem, has been diet controlled.  On ACE for renal protection, UTD on eye exam, foot exam.  Last A1C 6.8%  No numbness/tingling of hands/feet  HTN- chronic problem, on Lisinopril 10mg  daily w/ good control.  No CP, SOB, HAs, visual changes, edema.  Hyperlipidemia- chronic problem, on Crestor 20mg  daily.  No abd pain, N/V.   Review of Systems For ROS see HPI   This visit occurred during the SARS-CoV-2 public health emergency.  Safety protocols were in place, including screening questions prior to the visit, additional usage of staff PPE, and extensive cleaning of exam room while observing appropriate contact time as indicated for disinfecting solutions.       Objective:   Physical Exam Constitutional:      General: He is not in acute distress.    Appearance: He is well-developed and well-nourished. He is obese.  HENT:     Head: Normocephalic and atraumatic.  Eyes:     Extraocular Movements: EOM normal.     Conjunctiva/sclera: Conjunctivae normal.     Pupils: Pupils are equal, round, and reactive to light.  Neck:     Thyroid: No thyromegaly.  Cardiovascular:     Rate and Rhythm: Normal rate and regular rhythm.     Pulses: Intact distal pulses.     Heart sounds: Normal heart sounds. No murmur heard.   Pulmonary:     Effort: Pulmonary effort is normal. No respiratory distress.     Breath sounds: Normal breath sounds.  Abdominal:     General: Bowel sounds are normal. There is no distension.     Palpations: Abdomen is soft.  Musculoskeletal:        General: No edema.     Cervical back: Normal range of motion and neck supple.  Lymphadenopathy:     Cervical: No cervical adenopathy.  Skin:    General: Skin is warm and dry.  Neurological:     Mental Status: He is alert and oriented to person, place, and time.     Cranial Nerves: No cranial nerve deficit.   Psychiatric:        Mood and Affect: Mood and affect normal.        Behavior: Behavior normal.            Assessment & Plan:

## 2020-09-26 LAB — HEMOGLOBIN A1C
Hgb A1c MFr Bld: 6.9 % of total Hgb — ABNORMAL HIGH (ref <5.7)
Mean Plasma Glucose: 151 mg/dL
eAG (mmol/L): 8.4 mmol/L

## 2020-09-26 LAB — HEPATIC FUNCTION PANEL
AG Ratio: 1.7 (calc) (ref 1.0–2.5)
ALT: 20 U/L (ref 9–46)
AST: 20 U/L (ref 10–35)
Albumin: 4.5 g/dL (ref 3.6–5.1)
Alkaline phosphatase (APISO): 59 U/L (ref 35–144)
Bilirubin, Direct: 0.1 mg/dL (ref 0.0–0.2)
Globulin: 2.6 g/dL (calc) (ref 1.9–3.7)
Indirect Bilirubin: 0.5 mg/dL (calc) (ref 0.2–1.2)
Total Bilirubin: 0.6 mg/dL (ref 0.2–1.2)
Total Protein: 7.1 g/dL (ref 6.1–8.1)

## 2020-09-26 LAB — CBC WITH DIFFERENTIAL/PLATELET
Absolute Monocytes: 570 cells/uL (ref 200–950)
Basophils Absolute: 60 cells/uL (ref 0–200)
Basophils Relative: 1 %
Eosinophils Absolute: 138 cells/uL (ref 15–500)
Eosinophils Relative: 2.3 %
HCT: 42 % (ref 38.5–50.0)
Hemoglobin: 14.5 g/dL (ref 13.2–17.1)
Lymphs Abs: 2004 cells/uL (ref 850–3900)
MCH: 32.5 pg (ref 27.0–33.0)
MCHC: 34.5 g/dL (ref 32.0–36.0)
MCV: 94.2 fL (ref 80.0–100.0)
MPV: 10.9 fL (ref 7.5–12.5)
Monocytes Relative: 9.5 %
Neutro Abs: 3228 cells/uL (ref 1500–7800)
Neutrophils Relative %: 53.8 %
Platelets: 267 10*3/uL (ref 140–400)
RBC: 4.46 10*6/uL (ref 4.20–5.80)
RDW: 11.9 % (ref 11.0–15.0)
Total Lymphocyte: 33.4 %
WBC: 6 10*3/uL (ref 3.8–10.8)

## 2020-09-26 LAB — LIPID PANEL
Cholesterol: 202 mg/dL — ABNORMAL HIGH (ref <200)
HDL: 65 mg/dL (ref >=40)
LDL Cholesterol (Calc): 115 mg/dL (calc) — ABNORMAL HIGH
Non-HDL Cholesterol (Calc): 137 mg/dL (calc) — ABNORMAL HIGH (ref <130)
Total CHOL/HDL Ratio: 3.1 (calc) (ref <5.0)
Triglycerides: 108 mg/dL (ref <150)

## 2020-09-26 LAB — BASIC METABOLIC PANEL
BUN/Creatinine Ratio: 9 (calc) (ref 6–22)
BUN: 12 mg/dL (ref 7–25)
CO2: 26 mmol/L (ref 20–32)
Calcium: 9.7 mg/dL (ref 8.6–10.3)
Chloride: 101 mmol/L (ref 98–110)
Creat: 1.28 mg/dL — ABNORMAL HIGH (ref 0.70–1.25)
Glucose, Bld: 181 mg/dL — ABNORMAL HIGH (ref 65–99)
Potassium: 4.4 mmol/L (ref 3.5–5.3)
Sodium: 140 mmol/L (ref 135–146)

## 2020-09-26 LAB — TSH: TSH: 1.7 mIU/L (ref 0.40–4.50)

## 2020-09-28 ENCOUNTER — Other Ambulatory Visit: Payer: Self-pay

## 2020-09-28 DIAGNOSIS — R7989 Other specified abnormal findings of blood chemistry: Secondary | ICD-10-CM

## 2020-10-08 ENCOUNTER — Ambulatory Visit: Payer: Managed Care, Other (non HMO)

## 2020-10-08 ENCOUNTER — Other Ambulatory Visit: Payer: Managed Care, Other (non HMO)

## 2021-02-08 ENCOUNTER — Other Ambulatory Visit: Payer: Self-pay | Admitting: Family Medicine

## 2021-02-08 DIAGNOSIS — I10 Essential (primary) hypertension: Secondary | ICD-10-CM

## 2021-02-22 ENCOUNTER — Telehealth (INDEPENDENT_AMBULATORY_CARE_PROVIDER_SITE_OTHER): Payer: Managed Care, Other (non HMO) | Admitting: Family Medicine

## 2021-02-22 ENCOUNTER — Encounter: Payer: Self-pay | Admitting: Family Medicine

## 2021-02-22 DIAGNOSIS — J329 Chronic sinusitis, unspecified: Secondary | ICD-10-CM

## 2021-02-22 DIAGNOSIS — B9689 Other specified bacterial agents as the cause of diseases classified elsewhere: Secondary | ICD-10-CM | POA: Diagnosis not present

## 2021-02-22 MED ORDER — AMOXICILLIN 875 MG PO TABS: 875.0000 mg | ORAL_TABLET | Freq: Two times a day (BID) | ORAL | 0 refills | Status: AC

## 2021-02-22 MED ORDER — GUAIFENESIN-CODEINE 100-10 MG/5ML PO SYRP: 10.0000 mL | ORAL_SOLUTION | Freq: Three times a day (TID) | ORAL | 0 refills | Status: DC | PRN

## 2021-02-22 NOTE — Progress Notes (Signed)
I connected with  Tony Garza on 02/22/21 by a video enabled telemedicine application and verified that I am speaking with the correct person using two identifiers.   I discussed the limitations of evaluation and management by telemedicine. The patient expressed understanding and agreed to proceed.

## 2021-02-22 NOTE — Progress Notes (Signed)
Virtual Visit via Video   I connected with patient on 02/22/21 at  9:00 AM EDT by a video enabled telemedicine application and verified that I am speaking with the correct person using two identifiers.  Location patient: Home Location provider: [facility] Office Persons participating in the virtual visit: Patient, Provider, [place] (Sabrina M)  I discussed the limitations of evaluation and management by telemedicine and the availability of in person appointments. The patient expressed understanding and agreed to proceed.  Subjective:   HPI:   URI- sxs started 5 days ago w/ 'stuffed up head'.  Pt reports sxs worsened Friday, Saturday 'was really bad'.  Things are starting to improve.  + nasal congestion, cough.  Cough is lessening.  'it's mainly in my head an sinuses'.  Pt did home COVID on Saturday and Sunday- both negative.  No fevers.  No changes to taste/smell.  'my normal annual crud'.  No known sick contacts.  Denies excessive fatigue.  + facial pain, HA.  Some relief w/ AlkaSeltzer cold plus.  ROS:   See pertinent positives and negatives per HPI.  Patient Active Problem List   Diagnosis Date Noted  . 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 Skin tag 02/06/2015  . Erectile dysfunction associated with type 2 diabetes mellitus ([place]) 01/12/2015  . Eczema 06/18/2014  . Fungal dermatitis 06/18/2014  . Routine general medical examination at a health care facility 07/16/2012  . Neoplasm of uncertain behavior of skin 03/01/2012  . Diabetes mellitus, type 2 ([place]) 06/30/2011  . Essential hypertension 06/30/2011  . OTHER ANXIETY STATES 06/17/2010  . Morbid obesity ([place]) 09/07/2009  . GERD 11/03/2008  . Hyperlipidemia associated with type 2 diabetes mellitus ([place]) 07/28/2008  . OSTEOARTHRITIS 07/28/2008  . IMPOTENCE 07/25/2008  . ALCOHOL USE 07/25/2008  . 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 Smoking status: Former Smoker    Packs/day:  1.00    Years: 20.00    Pack years: 20.00    Quit date: 2008    Years since quitting: 14.3  . Smokeless tobacco: Never Used  Substance Use Topics  . Alcohol use: Yes    Alcohol/week: 15.0 standard drinks    Types: 15 Cans of beer per week    Comment: 15 beers per week per pt    Current Outpatient Medications:  .  aspirin 81 MG tablet, Take 81 mg by mouth daily., Disp: , Rfl:  .  glucosamine-chondroitin 500-400 MG tablet, Take 1 tablet by mouth daily., Disp: , Rfl:  .  KRILL OIL PO, Take by mouth., Disp: , Rfl:  .  lisinopril (ZESTRIL) 10 MG tablet, TAKE 1 TABLET DAILY, Disp: 90 tablet, Rfl: 1 .  Multiple Vitamin (MULTIVITAMIN) tablet, Take 1 tablet by mouth daily., Disp: , Rfl:  .  pantoprazole (PROTONIX) 40 MG tablet, Take 1 tablet (40 mg total) by mouth daily., Disp: 90 tablet, Rfl: 1 .  rosuvastatin (CRESTOR) 20 MG tablet, TAKE 1 TABLET DAILY (REPLACES SIMVASTATIN), Disp: 90 tablet, Rfl: 3 .  vitamin B-12 (CYANOCOBALAMIN) 1000 MCG tablet, Take 1,000 mcg by mouth daily., Disp: , Rfl:  .  vitamin C (ASCORBIC ACID) 500 MG tablet, Take 500 mg by mouth daily., Disp: , Rfl:   No Known Allergies  Objective:   There were no vitals taken for this visit.  AAOx3, NAD NCAT, EOMI No obvious CN deficits Coloring WNL Pt is able to speak clearly, coherently without shortness of breath or increased work  of breathing.  Thought process is linear.  Mood is appropriate.   Assessment and Plan:   Bacterial sinusitis- pt has hx of similar and he is fearful of this dragging on for weeks like it has previously.  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  Reviewed supportive care and red flags that should prompt return.  Pt expressed understanding and is in agreement w/ plan.    Annye Asa, MD 02/22/2021

## 2021-03-15 ENCOUNTER — Other Ambulatory Visit: Payer: Self-pay | Admitting: Family Medicine

## 2021-03-15 DIAGNOSIS — K219 Gastro-esophageal reflux disease without esophagitis: Secondary | ICD-10-CM

## 2021-03-29 ENCOUNTER — Encounter: Payer: Managed Care, Other (non HMO) | Admitting: Family Medicine

## 2021-04-07 ENCOUNTER — Encounter: Payer: Self-pay | Admitting: *Deleted

## 2021-04-19 ENCOUNTER — Encounter: Payer: Managed Care, Other (non HMO) | Admitting: Family Medicine

## 2021-07-07 LAB — HM DIABETES EYE EXAM

## 2021-07-09 ENCOUNTER — Encounter: Payer: Self-pay | Admitting: Family Medicine

## 2021-07-21 ENCOUNTER — Encounter: Payer: Self-pay | Admitting: Family Medicine

## 2021-07-21 ENCOUNTER — Ambulatory Visit (INDEPENDENT_AMBULATORY_CARE_PROVIDER_SITE_OTHER): Payer: Managed Care, Other (non HMO) | Admitting: Family Medicine

## 2021-07-21 ENCOUNTER — Other Ambulatory Visit: Payer: Self-pay

## 2021-07-21 VITALS — BP 120/82 | HR 55 | Temp 99.0°F | Resp 20 | Ht 71.0 in | Wt 256.2 lb

## 2021-07-21 DIAGNOSIS — E119 Type 2 diabetes mellitus without complications: Secondary | ICD-10-CM

## 2021-07-21 DIAGNOSIS — E785 Hyperlipidemia, unspecified: Secondary | ICD-10-CM | POA: Diagnosis not present

## 2021-07-21 DIAGNOSIS — E1169 Type 2 diabetes mellitus with other specified complication: Secondary | ICD-10-CM

## 2021-07-21 DIAGNOSIS — Z0000 Encounter for general adult medical examination without abnormal findings: Secondary | ICD-10-CM | POA: Diagnosis not present

## 2021-07-21 DIAGNOSIS — Z23 Encounter for immunization: Secondary | ICD-10-CM | POA: Diagnosis not present

## 2021-07-21 DIAGNOSIS — Z125 Encounter for screening for malignant neoplasm of prostate: Secondary | ICD-10-CM | POA: Diagnosis not present

## 2021-07-21 DIAGNOSIS — Z1283 Encounter for screening for malignant neoplasm of skin: Secondary | ICD-10-CM

## 2021-07-21 LAB — CBC WITH DIFFERENTIAL/PLATELET
Basophils Absolute: 0.1 10*3/uL (ref 0.0–0.1)
Basophils Relative: 1.1 % (ref 0.0–3.0)
Eosinophils Absolute: 0.1 10*3/uL (ref 0.0–0.7)
Eosinophils Relative: 2.5 % (ref 0.0–5.0)
HCT: 43.6 % (ref 39.0–52.0)
Hemoglobin: 14.5 g/dL (ref 13.0–17.0)
Lymphocytes Relative: 34.4 % (ref 12.0–46.0)
Lymphs Abs: 1.8 10*3/uL (ref 0.7–4.0)
MCHC: 33.2 g/dL (ref 30.0–36.0)
MCV: 96 fl (ref 78.0–100.0)
Monocytes Absolute: 0.5 10*3/uL (ref 0.1–1.0)
Monocytes Relative: 9.2 % (ref 3.0–12.0)
Neutro Abs: 2.8 10*3/uL (ref 1.4–7.7)
Neutrophils Relative %: 52.8 % (ref 43.0–77.0)
Platelets: 246 10*3/uL (ref 150.0–400.0)
RBC: 4.54 Mil/uL (ref 4.22–5.81)
RDW: 13.3 % (ref 11.5–15.5)
WBC: 5.3 10*3/uL (ref 4.0–10.5)

## 2021-07-21 LAB — BASIC METABOLIC PANEL
BUN: 11 mg/dL (ref 6–23)
CO2: 29 mEq/L (ref 19–32)
Calcium: 9.6 mg/dL (ref 8.4–10.5)
Chloride: 101 mEq/L (ref 96–112)
Creatinine, Ser: 1.02 mg/dL (ref 0.40–1.50)
GFR: 76.28 mL/min (ref >60.00)
Glucose, Bld: 155 mg/dL — ABNORMAL HIGH (ref 70–99)
Potassium: 4.2 mEq/L (ref 3.5–5.1)
Sodium: 139 mEq/L (ref 135–145)

## 2021-07-21 LAB — HEPATIC FUNCTION PANEL
ALT: 19 U/L (ref 0–53)
AST: 20 U/L (ref 0–37)
Albumin: 4.6 g/dL (ref 3.5–5.2)
Alkaline Phosphatase: 66 U/L (ref 39–117)
Bilirubin, Direct: 0.2 mg/dL (ref 0.0–0.3)
Total Bilirubin: 0.8 mg/dL (ref 0.2–1.2)
Total Protein: 7.4 g/dL (ref 6.0–8.3)

## 2021-07-21 LAB — LIPID PANEL
Cholesterol: 211 mg/dL — ABNORMAL HIGH (ref 0–200)
HDL: 79.9 mg/dL (ref >39.00)
LDL Cholesterol: 109 mg/dL — ABNORMAL HIGH (ref 0–99)
NonHDL: 130.8
Total CHOL/HDL Ratio: 3
Triglycerides: 110 mg/dL (ref 0.0–149.0)
VLDL: 22 mg/dL (ref 0.0–40.0)

## 2021-07-21 LAB — TSH: TSH: 1.47 u[IU]/mL (ref 0.35–5.50)

## 2021-07-21 LAB — PSA: PSA: 1.7 ng/mL (ref 0.10–4.00)

## 2021-07-21 LAB — HEMOGLOBIN A1C: Hgb A1c MFr Bld: 6.9 % — ABNORMAL HIGH (ref 4.6–6.5)

## 2021-07-21 MED ORDER — TRIAMCINOLONE ACETONIDE 0.025 % EX OINT: 1.0000 "application " | TOPICAL_OINTMENT | Freq: Two times a day (BID) | CUTANEOUS | 1 refills | Status: DC

## 2021-07-21 NOTE — Progress Notes (Signed)
   Subjective:    Patient ID: Tony Garza, male    DOB: [birth year], 65 y.o.   MRN: [id number]  HPI CPE- UTD on colonoscopy, eye exam.  Due for foot exam.  On ACE for renal protection.  Due for flu, Prevnar, Zoster.  Driving to [employer].  Exercising regularly  Patient Care Team    Relationship Specialty Notifications Start End  Midge Minium, MD PCP - General Family Medicine  02/22/12    Comment: Gwendlyn Deutscher, MD Consulting Physician Gastroenterology  03/27/20     Health Maintenance  Topic Date Due   Zoster Vaccines- Shingrix (1 of 2) Never done   HEMOGLOBIN A1C  03/26/2021   FOOT EXAM  03/27/2021   INFLUENZA VACCINE  05/10/2021   COVID-19 Vaccine (4 - Booster for [place] series) 10/09/2021 (Originally 10/13/2020)   Hepatitis C Screening  02/22/2022 (Originally 06/18/1974)   COLONOSCOPY (Pts 45-73yrs Insurance coverage will need to be confirmed)  02/20/2022   OPHTHALMOLOGY EXAM  07/07/2022   TETANUS/TDAP  07/23/2023   HPV VACCINES  Aged Out      Review of Systems Patient reports no vision/hearing changes, anorexia, fever ,adenopathy, persistant/recurrent hoarseness, swallowing issues, chest pain, palpitations, edema, persistant/recurrent cough, hemoptysis, dyspnea (rest,exertional, paroxysmal nocturnal), gastrointestinal  bleeding (melena, rectal bleeding), abdominal pain, excessive heart burn, GU symptoms (dysuria, hematuria, voiding/incontinence issues) syncope, focal weakness, memory loss, numbness & tingling, skin/hair/nail changes, depression, anxiety, abnormal bruising/bleeding, musculoskeletal symptoms/signs.   This visit occurred during the SARS-CoV-2 public health emergency.  Safety protocols were in place, including screening questions prior to the visit, additional usage of staff PPE, and extensive cleaning of exam room while observing appropriate contact time as indicated for disinfecting solutions.      Objective:   Physical Exam General  Appearance:    Alert, cooperative, no distress, appears stated age, obese  Head:    Normocephalic, without obvious abnormality, atraumatic  Eyes:    PERRL, conjunctiva/corneas clear, EOM's intact, fundi    benign, both eyes       Ears:    Normal TM's and external ear canals, both ears  Nose:   Deferred due to COVID  Throat:   Neck:   Supple, symmetrical, trachea midline, no adenopathy;       thyroid:  No enlargement/tenderness/nodules  Back:     Symmetric, no curvature, ROM normal, no CVA tenderness  Lungs:     Clear to auscultation bilaterally, respirations unlabored  Chest wall:    No tenderness or deformity  Heart:    Regular rate and rhythm, S1 and S2 normal, no murmur, rub   or gallop  Abdomen:     Soft, non-tender, bowel sounds active all four quadrants,    no masses, no organomegaly  Genitalia:    deferred  Rectal:    Extremities:   Extremities normal, atraumatic, no cyanosis or edema  Pulses:   2+ and symmetric all extremities  Skin:   Skin color, texture, turgor normal, no rashes or lesions  Lymph nodes:   Cervical, supraclavicular, and axillary nodes normal  Neurologic:   CNII-XII intact. Normal strength, sensation and reflexes      throughout          Assessment & Plan:

## 2021-07-21 NOTE — Patient Instructions (Signed)
Follow up in 6 months to recheck BP, cholesterol, sugar We'll notify you of your lab results and make any changes if needed Continue to work on healthy diet and regular exercise- you're doing great! We'll call you with your Dermatology appt When you get back we can schedule your pneumonia, shingles, and COVID shots Call with any questions or concerns Safe Tony Garza!!!

## 2021-07-21 NOTE — Assessment & Plan Note (Signed)
Chronic problem, tolerating Crestor w/o difficulty.  Check labs.  Adjust meds prn

## 2021-07-21 NOTE — Assessment & Plan Note (Signed)
Chronic problem.  UTD on eye exam.  Foot exam done today.  On ACE for renal protection.  Check labs.  Adjust meds prn

## 2021-07-21 NOTE — Assessment & Plan Note (Signed)
BMI is 35.73 but with his other medical conditions this qualifies as morbid obesity.  Pt exercises regularly.  Applauded his efforts and encouraged him to continue.

## 2021-07-21 NOTE — Assessment & Plan Note (Signed)
Pt's PE unchanged and WNL w/ exception of obesity.  UTD on colonoscopy.  Due for Prevnar and Zoster but he is heading out of town and would like to wait.  Flu shot given today.  Check labs.  Anticipatory guidance provided.

## 2021-08-03 ENCOUNTER — Telehealth: Payer: Self-pay | Admitting: Family Medicine

## 2021-08-03 NOTE — Telephone Encounter (Signed)
He can get pneumonia and shingles done at the same time and there is no waiting period between those vaccines and the COVID booster.  The shingles shot will have to be done at his pharmacy if he has Medicare b/c this is a pharmacy benefit and not a medical benefit (for whatever reason).

## 2021-08-03 NOTE — Telephone Encounter (Signed)
I have called and inform patient that per pcp he can get both vaccines at one time and no waiting period for covid booster.

## 2021-08-03 NOTE — Telephone Encounter (Signed)
Please advise 

## 2021-08-03 NOTE — Telephone Encounter (Signed)
Pt wants to know if you can get the pneumonia vaccine and the shingles vaccine done at the same time? If so he would need a nurse visit.   He also wanted to know id there a wait time to get the covid shot before or after vaccines listed above.  Home #

## 2021-08-09 ENCOUNTER — Other Ambulatory Visit: Payer: Self-pay | Admitting: Family Medicine

## 2021-08-09 DIAGNOSIS — I10 Essential (primary) hypertension: Secondary | ICD-10-CM

## 2021-08-12 ENCOUNTER — Other Ambulatory Visit: Payer: Self-pay

## 2021-08-12 ENCOUNTER — Ambulatory Visit (INDEPENDENT_AMBULATORY_CARE_PROVIDER_SITE_OTHER): Payer: Managed Care, Other (non HMO) | Admitting: Family Medicine

## 2021-08-12 DIAGNOSIS — Z23 Encounter for immunization: Secondary | ICD-10-CM | POA: Diagnosis not present

## 2021-08-25 ENCOUNTER — Encounter: Payer: Self-pay | Admitting: Family Medicine

## 2021-08-25 ENCOUNTER — Telehealth (INDEPENDENT_AMBULATORY_CARE_PROVIDER_SITE_OTHER): Payer: Managed Care, Other (non HMO) | Admitting: Family Medicine

## 2021-08-25 VITALS — Ht 72.0 in | Wt 255.0 lb

## 2021-08-25 DIAGNOSIS — R058 Other specified cough: Secondary | ICD-10-CM | POA: Diagnosis not present

## 2021-08-25 MED ORDER — PREDNISONE 10 MG PO TABS: ORAL_TABLET | ORAL | 0 refills | Status: DC

## 2021-08-25 MED ORDER — ALBUTEROL SULFATE HFA 108 (90 BASE) MCG/ACT IN AERS: 2.0000 | INHALATION_SPRAY | Freq: Four times a day (QID) | RESPIRATORY_TRACT | 0 refills | Status: DC | PRN

## 2021-08-25 MED ORDER — GUAIFENESIN-CODEINE 100-10 MG/5ML PO SYRP: 10.0000 mL | ORAL_SOLUTION | Freq: Three times a day (TID) | ORAL | 0 refills | Status: DC | PRN

## 2021-08-25 NOTE — Progress Notes (Signed)
Virtual Visit via Video   I connected with patient on 08/25/21 at  2:30 PM EST by a video enabled telemedicine application and verified that I am speaking with the correct person using two identifiers.  Location patient: Home Location provider: [facility], [facility] Persons participating in the virtual visit: Patient, Provider, Norco Claiborne Billings C)  I discussed the limitations of evaluation and management by telemedicine and the availability of in person appointments. The patient expressed understanding and agreed to proceed.  Subjective:   HPI:   URI- pt reports sxs started 8 days ago.  Sxs started w/ sore throat.  He feels that things are getting better but cough gets worse at night.  Cough is wet but not productive.  No fevers.  COVID test (-).  Some chest tightness and SOB.  Pt reports sinuses are full and there will 'whistling' as air moves through.  Taking Alka Seltzer Cold Plus w/ some relief.  Pt able to work out and go to [employer].  ROS:   See pertinent positives and negatives per HPI.  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   Skin tag 02/06/2015   Erectile dysfunction associated with type 2 diabetes mellitus ([place]) 01/12/2015   Eczema 06/18/2014   Fungal dermatitis 06/18/2014   Routine general medical examination at a health care facility 07/16/2012   Neoplasm of uncertain behavior of skin 03/01/2012   Diabetes mellitus, type 2 ([place]) 06/30/2011   Essential hypertension 06/30/2011   OTHER ANXIETY STATES 06/17/2010   Morbid obesity ([employer]) 09/07/2009   GERD 11/03/2008   Hyperlipidemia associated with type 2 diabetes mellitus ([place]) 07/28/2008   OSTEOARTHRITIS 07/28/2008   IMPOTENCE 07/25/2008   ALCOHOL USE 07/25/2008   PEYRONIE'S DISEASE, HX OF 07/25/2008    Social History   Tobacco Use   Smoking status: Former    Packs/day: 1.00    Years: 20.00    Pack years: 20.00    Types: Cigarettes    Quit date: 2008     Years since quitting: 14.8   Smokeless tobacco: Never  Substance Use Topics   Alcohol use: Yes    Alcohol/week: 15.0 standard drinks    Types: 15 Cans of beer per week    Comment: 15 beers per week per pt    Current Outpatient Medications:    aspirin 81 MG tablet, Take 81 mg by mouth daily., Disp: , Rfl:    glucosamine-chondroitin 500-400 MG tablet, Take 1 tablet by mouth daily., Disp: , Rfl:    guaiFENesin-codeine (ROBITUSSIN AC) 100-10 MG/5ML syrup, Take 10 mLs by mouth 3 (three) times daily as needed for cough., Disp: 120 mL, Rfl: 0   KRILL OIL PO, Take by mouth., Disp: , Rfl:    lisinopril (ZESTRIL) 10 MG tablet, TAKE 1 TABLET DAILY, Disp: 90 tablet, Rfl: 3   Multiple Vitamin (MULTIVITAMIN) tablet, Take 1 tablet by mouth daily., Disp: , Rfl:    pantoprazole (PROTONIX) 40 MG tablet, TAKE 1 TABLET DAILY, Disp: 90 tablet, Rfl: 3   rosuvastatin (CRESTOR) 20 MG tablet, TAKE 1 TABLET DAILY (REPLACES SIMVASTATIN), Disp: 90 tablet, Rfl: 3   triamcinolone (KENALOG) 0.025 % ointment, Apply 1 application topically 2 (two) times daily., Disp: 80 g, Rfl: 1   vitamin B-12 (CYANOCOBALAMIN) 1000 MCG tablet, Take 1,000 mcg by mouth daily., Disp: , Rfl:    vitamin C (ASCORBIC ACID) 500 MG tablet, Take 500 mg by mouth daily., Disp: , Rfl:   No Known  Allergies  Objective:   Ht 6' (1.829 m)   Wt 255 lb (115.7 kg) Comment: pt reported  BMI 34.58 kg/m  AAOx3, NAD NCAT, EOMI No obvious CN deficits Coloring WNL Pt is able to speak clearly, coherently without shortness of breath or increased work of breathing.  + wet cough Thought process is linear.  Mood is appropriate.   Assessment and Plan:   Post-viral cough- new.  Pt reports he is on day 8 of sxs but other than his cough, he is feeling much better.  Cough is keeping him awake at night and causing SOB and wheezing.  Will start Albuterol prn.  Will do a steroid taper but cautioned pt that with his diabetes, this will cause his sugars to spike.   Discussed that the benefit outweighs the risk of a few days of high sugars.  Cough syrup prn.  Reviewed supportive care and red flags that should prompt return.  Pt expressed understanding and is in agreement w/ plan.    Annye Asa, MD 08/25/2021

## 2021-09-10 ENCOUNTER — Other Ambulatory Visit: Payer: Self-pay | Admitting: Family Medicine

## 2021-09-17 ENCOUNTER — Other Ambulatory Visit: Payer: Self-pay | Admitting: Family Medicine

## 2022-01-19 ENCOUNTER — Ambulatory Visit: Payer: Managed Care, Other (non HMO) | Admitting: Family Medicine

## 2022-01-26 ENCOUNTER — Encounter: Payer: Self-pay | Admitting: Family Medicine

## 2022-01-26 ENCOUNTER — Ambulatory Visit: Payer: Managed Care, Other (non HMO) | Admitting: Family Medicine

## 2022-01-26 ENCOUNTER — Other Ambulatory Visit: Payer: Self-pay

## 2022-01-26 VITALS — BP 128/78 | HR 68 | Temp 98.5°F | Resp 18 | Ht 72.0 in | Wt 258.0 lb

## 2022-01-26 DIAGNOSIS — E1169 Type 2 diabetes mellitus with other specified complication: Secondary | ICD-10-CM

## 2022-01-26 DIAGNOSIS — I10 Essential (primary) hypertension: Secondary | ICD-10-CM

## 2022-01-26 DIAGNOSIS — E785 Hyperlipidemia, unspecified: Secondary | ICD-10-CM

## 2022-01-26 DIAGNOSIS — E119 Type 2 diabetes mellitus without complications: Secondary | ICD-10-CM

## 2022-01-26 LAB — MICROALBUMIN / CREATININE URINE RATIO
Creatinine,U: 38.1 mg/dL
Microalb Creat Ratio: 1.8 mg/g (ref 0.0–30.0)
Microalb, Ur: <0.7 mg/dL (ref 0.0–1.9)

## 2022-01-26 LAB — HEPATIC FUNCTION PANEL
ALT: 22 U/L (ref 0–53)
AST: 22 U/L (ref 0–37)
Albumin: 4.5 g/dL (ref 3.5–5.2)
Alkaline Phosphatase: 58 U/L (ref 39–117)
Bilirubin, Direct: 0.2 mg/dL (ref 0.0–0.3)
Total Bilirubin: 0.8 mg/dL (ref 0.2–1.2)
Total Protein: 7 g/dL (ref 6.0–8.3)

## 2022-01-26 LAB — BASIC METABOLIC PANEL
BUN: 10 mg/dL (ref 6–23)
CO2: 29 mEq/L (ref 19–32)
Calcium: 9.3 mg/dL (ref 8.4–10.5)
Chloride: 101 mEq/L (ref 96–112)
Creatinine, Ser: 0.98 mg/dL (ref 0.40–1.50)
GFR: 79.74 mL/min (ref >60.00)
Glucose, Bld: 112 mg/dL — ABNORMAL HIGH (ref 70–99)
Potassium: 4.2 mEq/L (ref 3.5–5.1)
Sodium: 138 mEq/L (ref 135–145)

## 2022-01-26 LAB — CBC WITH DIFFERENTIAL/PLATELET
Basophils Absolute: 0.1 10*3/uL (ref 0.0–0.1)
Basophils Relative: 0.9 % (ref 0.0–3.0)
Eosinophils Absolute: 0.1 10*3/uL (ref 0.0–0.7)
Eosinophils Relative: 2.3 % (ref 0.0–5.0)
HCT: 43.3 % (ref 39.0–52.0)
Hemoglobin: 14.4 g/dL (ref 13.0–17.0)
Lymphocytes Relative: 32.6 % (ref 12.0–46.0)
Lymphs Abs: 2 10*3/uL (ref 0.7–4.0)
MCHC: 33.3 g/dL (ref 30.0–36.0)
MCV: 95.1 fl (ref 78.0–100.0)
Monocytes Absolute: 0.5 10*3/uL (ref 0.1–1.0)
Monocytes Relative: 8.2 % (ref 3.0–12.0)
Neutro Abs: 3.5 10*3/uL (ref 1.4–7.7)
Neutrophils Relative %: 56 % (ref 43.0–77.0)
Platelets: 229 10*3/uL (ref 150.0–400.0)
RBC: 4.55 Mil/uL (ref 4.22–5.81)
RDW: 13 % (ref 11.5–15.5)
WBC: 6.2 10*3/uL (ref 4.0–10.5)

## 2022-01-26 LAB — LIPID PANEL
Cholesterol: 221 mg/dL — ABNORMAL HIGH (ref 0–200)
HDL: 79 mg/dL (ref >39.00)
LDL Cholesterol: 125 mg/dL — ABNORMAL HIGH (ref 0–99)
NonHDL: 142.11
Total CHOL/HDL Ratio: 3
Triglycerides: 85 mg/dL (ref 0.0–149.0)
VLDL: 17 mg/dL (ref 0.0–40.0)

## 2022-01-26 LAB — HEMOGLOBIN A1C: Hgb A1c MFr Bld: 7.4 % — ABNORMAL HIGH (ref 4.6–6.5)

## 2022-01-26 LAB — TSH: TSH: 1.34 u[IU]/mL (ref 0.35–5.50)

## 2022-01-26 MED ORDER — TRIAMCINOLONE ACETONIDE 0.1 % EX CREA: 1.0000 "application " | TOPICAL_CREAM | Freq: Two times a day (BID) | CUTANEOUS | 1 refills | Status: AC

## 2022-01-26 NOTE — Assessment & Plan Note (Signed)
Chronic problem.  Currently diet controlled.  UTD on foot exam, eye exam, and on ACE for renal protection.  Will get microalbumin due to change in quality guidelines.  Encouraged low carb diet and regular exercise.  Check labs.  Start meds if needed. ?

## 2022-01-26 NOTE — Progress Notes (Signed)
? ?  Subjective:  ? ? Patient ID: Tony Garza, male    DOB: 1956/08/29, 66 y.o.   MRN: [id number] ? ?HPI ?DM- chronic problem, pt has been able to control w/ diet and exercise.  UTD on foot exam, eye exam, and on ACE for renal protection.  Will need microalbumin today. Last A1C 6.9%.  no symptomatic lows.  No numbness/tingling of hands/feet. ? ?HTN- chronic problem.  Currently on Lisinopril '10mg'$  daily w/ good control.  No CP, SOB, HAs, visual changes, edema. ? ?Hyperlipidemia- chronic problem, currently on Crestor '20mg'$  daily.  Pt is exercising regularly at the gym.  No abd pain, N/V. ? ? ?Review of Systems ?For ROS see HPI  ?   ?Objective:  ? Physical Exam ?Vitals reviewed.  ?Constitutional:   ?   General: He is not in acute distress. ?   Appearance: Normal appearance. He is well-developed. He is obese. He is not ill-appearing.  ?HENT:  ?   Head: Normocephalic and atraumatic.  ?Eyes:  ?   Extraocular Movements: Extraocular movements intact.  ?   Conjunctiva/sclera: Conjunctivae normal.  ?   Pupils: Pupils are equal, round, and reactive to light.  ?Neck:  ?   Thyroid: No thyromegaly.  ?Cardiovascular:  ?   Rate and Rhythm: Normal rate and regular rhythm.  ?   Pulses: Normal pulses.  ?   Heart sounds: Normal heart sounds. No murmur heard. ?Pulmonary:  ?   Effort: Pulmonary effort is normal. No respiratory distress.  ?   Breath sounds: Normal breath sounds.  ?Abdominal:  ?   General: Bowel sounds are normal. There is no distension.  ?   Palpations: Abdomen is soft.  ?Musculoskeletal:  ?   Cervical back: Normal range of motion and neck supple.  ?   Right lower leg: No edema.  ?   Left lower leg: No edema.  ?Lymphadenopathy:  ?   Cervical: No cervical adenopathy.  ?Skin: ?   General: Skin is warm and dry.  ?Neurological:  ?   General: No focal deficit present.  ?   Mental Status: He is alert and oriented to person, place, and time.  ?   Cranial Nerves: No cranial nerve deficit.  ?Psychiatric:     ?   Mood and Affect:  Mood normal.     ?   Behavior: Behavior normal.  ? ? ? ? ? ?   ?Assessment & Plan:  ? ? ?

## 2022-01-26 NOTE — Assessment & Plan Note (Signed)
Chronic problem.  Tolerating Crestor 20mg daily w/o difficulty.  Check labs.  Adjust meds prn  

## 2022-01-26 NOTE — Assessment & Plan Note (Signed)
Chronic problem.  Well controlled today on Lisinopril '10mg'$  daily.  Asymptomatic.  Check labs due to ACE but no anticipated med changes. ?

## 2022-01-26 NOTE — Patient Instructions (Addendum)
Schedule your complete physical in 6 months ?We'll notify you of your lab results and make any changes if needed ?Continue to work on healthy diet and regular exercise- you're doing great! ?Call with any questions or concerns ?Stay safe!  Stay healthy! ?Safe travels!!! ?

## 2022-03-10 ENCOUNTER — Other Ambulatory Visit: Payer: Self-pay | Admitting: Family Medicine

## 2022-03-10 DIAGNOSIS — K219 Gastro-esophageal reflux disease without esophagitis: Secondary | ICD-10-CM

## 2022-07-29 ENCOUNTER — Ambulatory Visit (INDEPENDENT_AMBULATORY_CARE_PROVIDER_SITE_OTHER): Payer: Managed Care, Other (non HMO) | Admitting: Family Medicine

## 2022-07-29 ENCOUNTER — Encounter: Payer: Self-pay | Admitting: Family Medicine

## 2022-07-29 VITALS — BP 128/74 | HR 65 | Temp 97.8°F | Ht 70.0 in | Wt 257.0 lb

## 2022-07-29 DIAGNOSIS — Z0000 Encounter for general adult medical examination without abnormal findings: Secondary | ICD-10-CM | POA: Diagnosis not present

## 2022-07-29 DIAGNOSIS — Z23 Encounter for immunization: Secondary | ICD-10-CM | POA: Diagnosis not present

## 2022-07-29 DIAGNOSIS — Z1211 Encounter for screening for malignant neoplasm of colon: Secondary | ICD-10-CM

## 2022-07-29 DIAGNOSIS — E119 Type 2 diabetes mellitus without complications: Secondary | ICD-10-CM | POA: Diagnosis not present

## 2022-07-29 DIAGNOSIS — Z125 Encounter for screening for malignant neoplasm of prostate: Secondary | ICD-10-CM | POA: Diagnosis not present

## 2022-07-29 LAB — CBC WITH DIFFERENTIAL/PLATELET
Basophils Absolute: 0.1 10*3/uL (ref 0.0–0.1)
Basophils Relative: 1.1 % (ref 0.0–3.0)
Eosinophils Absolute: 0.2 10*3/uL (ref 0.0–0.7)
Eosinophils Relative: 3.1 % (ref 0.0–5.0)
HCT: 44.3 % (ref 39.0–52.0)
Hemoglobin: 14.9 g/dL (ref 13.0–17.0)
Lymphocytes Relative: 28.8 % (ref 12.0–46.0)
Lymphs Abs: 1.8 10*3/uL (ref 0.7–4.0)
MCHC: 33.6 g/dL (ref 30.0–36.0)
MCV: 95.4 fl (ref 78.0–100.0)
Monocytes Absolute: 0.5 10*3/uL (ref 0.1–1.0)
Monocytes Relative: 8.5 % (ref 3.0–12.0)
Neutro Abs: 3.6 10*3/uL (ref 1.4–7.7)
Neutrophils Relative %: 58.5 % (ref 43.0–77.0)
Platelets: 262 10*3/uL (ref 150.0–400.0)
RBC: 4.65 Mil/uL (ref 4.22–5.81)
RDW: 13 % (ref 11.5–15.5)
WBC: 6.1 10*3/uL (ref 4.0–10.5)

## 2022-07-29 LAB — HEPATIC FUNCTION PANEL
ALT: 19 U/L (ref 0–53)
AST: 19 U/L (ref 0–37)
Albumin: 4.6 g/dL (ref 3.5–5.2)
Alkaline Phosphatase: 68 U/L (ref 39–117)
Bilirubin, Direct: 0.2 mg/dL (ref 0.0–0.3)
Total Bilirubin: 0.6 mg/dL (ref 0.2–1.2)
Total Protein: 7.3 g/dL (ref 6.0–8.3)

## 2022-07-29 LAB — HEMOGLOBIN A1C: Hgb A1c MFr Bld: 7.3 % — ABNORMAL HIGH (ref 4.6–6.5)

## 2022-07-29 LAB — BASIC METABOLIC PANEL
BUN: 10 mg/dL (ref 6–23)
CO2: 28 mEq/L (ref 19–32)
Calcium: 9.7 mg/dL (ref 8.4–10.5)
Chloride: 99 mEq/L (ref 96–112)
Creatinine, Ser: 1.14 mg/dL (ref 0.40–1.50)
GFR: 66.27 mL/min (ref >60.00)
Glucose, Bld: 192 mg/dL — ABNORMAL HIGH (ref 70–99)
Potassium: 4.6 mEq/L (ref 3.5–5.1)
Sodium: 139 mEq/L (ref 135–145)

## 2022-07-29 LAB — LIPID PANEL
Cholesterol: 207 mg/dL — ABNORMAL HIGH (ref 0–200)
HDL: 78.7 mg/dL (ref >39.00)
LDL Cholesterol: 102 mg/dL — ABNORMAL HIGH (ref 0–99)
NonHDL: 127.99
Total CHOL/HDL Ratio: 3
Triglycerides: 132 mg/dL (ref 0.0–149.0)
VLDL: 26.4 mg/dL (ref 0.0–40.0)

## 2022-07-29 LAB — TSH: TSH: 1.65 u[IU]/mL (ref 0.35–5.50)

## 2022-07-29 LAB — PSA: PSA: 2.29 ng/mL (ref 0.10–4.00)

## 2022-07-29 NOTE — Assessment & Plan Note (Signed)
Pt's PE WNL w/ exception of BMI.  Due for repeat colonoscopy- referral placed.  Eye exam scheduled.  UTD on PNA, Tdap.  Flu shot given today.  Check labs.  Anticipatory guidance provided.

## 2022-07-29 NOTE — Assessment & Plan Note (Signed)
Unchanged.  Pt's BMI of 36.88 qualifies as morbidly obese due to HTN, hyperlipidemia, and DM.  Encouraged low carb diet.  Pt exercises regularly at the gym.  Applauded his efforts.  Will follow.

## 2022-07-29 NOTE — Patient Instructions (Addendum)
Follow up in 6 months to recheck sugar, BP, and cholesterol We'll notify you of your lab results and make any changes if needed Continue to work on healthy diet and regular exercise- you can do it! Have them send me a copy of your eye exam We'll call you to schedule your colonoscopy appt Call with any questions or concerns Stay Safe!  Stay Healthy! Happy Fall!!!

## 2022-07-29 NOTE — Assessment & Plan Note (Signed)
Chronic problem.  UTD on microalbumin.  Foot exam done today.  Eye exam scheduled.  Check labs.  Adjust meds prn

## 2022-07-29 NOTE — Progress Notes (Signed)
   Subjective:    Patient ID: Tony Garza, male    DOB: [birth year], 66 y.o.   MRN: [id number]  HPI CPE- Due for colonoscopy, eye exam (scheduled for 10/31), foot exam.  UTD on microalbumin and Tdap.  Will get flu shot today.  Patient Care Team    Relationship Specialty Notifications Start End  Midge Minium, MD PCP - General Family Medicine  02/22/12    Comment: Gwendlyn Deutscher, MD Consulting Physician Gastroenterology  03/27/20     Health Maintenance  Topic Date Due   Zoster Vaccines- Shingrix (2 of 2) 10/07/2021   COLONOSCOPY (Pts 45-30yr Insurance coverage will need to be confirmed)  02/20/2022   INFLUENZA VACCINE  05/10/2022   HEMOGLOBIN A1C  07/28/2022   OPHTHALMOLOGY EXAM  07/29/2022 (Originally 07/07/2022)   FOOT EXAM  07/30/2022 (Originally 07/21/2022)   COVID-19 Vaccine (4 - Pf[employer] risk series) 08/14/2022 (Originally 09/15/2020)   Hepatitis C Screening  07/30/2023 (Originally [birth year]/1972)   Diabetic kidney evaluation - GFR measurement  01/27/2023   Diabetic kidney evaluation - Urine ACR  01/27/2023   TETANUS/TDAP  07/23/2023   Pneumonia Vaccine 66 Years old  Completed   HPV VACCINES  Aged Out      Review of Systems Patient reports no vision/hearing changes, anorexia, fever ,adenopathy, persistant/recurrent hoarseness, swallowing issues, chest pain, palpitations, edema, persistant/recurrent cough, hemoptysis, dyspnea (rest,exertional, paroxysmal nocturnal), gastrointestinal  bleeding (melena, rectal bleeding), abdominal pain, excessive heart burn, GU symptoms (dysuria, hematuria, voiding/incontinence issues) syncope, focal weakness, memory loss, numbness & tingling, skin/hair/nail changes, depression, anxiety, abnormal bruising/bleeding, musculoskeletal symptoms/signs.     Objective:   Physical Exam General Appearance:    Alert, cooperative, no distress, appears stated age, obese  Head:    Normocephalic, without obvious abnormality, atraumatic  Eyes:     PERRL, conjunctiva/corneas clear, EOM's intact both eyes       Ears:    Normal TM's and external ear canals, both ears  Nose:   Nares normal, septum midline, mucosa normal, no drainage   or sinus tenderness  Throat:   Lips, mucosa, and tongue normal; teeth and gums normal  Neck:   Supple, symmetrical, trachea midline, no adenopathy;       thyroid:  No enlargement/tenderness/nodules  Back:     Symmetric, no curvature, ROM normal, no CVA tenderness  Lungs:     Clear to auscultation bilaterally, respirations unlabored  Chest wall:    No tenderness or deformity  Heart:    Regular rate and rhythm, S1 and S2 normal, no murmur, rub   or gallop  Abdomen:     Soft, non-tender, bowel sounds active all four quadrants,    no masses, no organomegaly  Genitalia:    deferred  Rectal:    Extremities:   Extremities normal, atraumatic, no cyanosis or edema  Pulses:   2+ and symmetric all extremities  Skin:   Skin color, texture, turgor normal, no rashes or lesions  Lymph nodes:   Cervical, supraclavicular, and axillary nodes normal  Neurologic:   CNII-XII intact. Normal strength, sensation and reflexes      throughout          Assessment & Plan:

## 2022-08-01 NOTE — Progress Notes (Signed)
Informed pt of lab results  

## 2022-08-02 ENCOUNTER — Other Ambulatory Visit: Payer: Self-pay | Admitting: Family Medicine

## 2022-08-02 DIAGNOSIS — I10 Essential (primary) hypertension: Secondary | ICD-10-CM

## 2022-08-08 ENCOUNTER — Encounter: Payer: Self-pay | Admitting: Family Medicine

## 2022-08-09 LAB — HM DIABETES EYE EXAM

## 2022-08-30 ENCOUNTER — Ambulatory Visit (AMBULATORY_SURGERY_CENTER): Payer: Self-pay | Admitting: *Deleted

## 2022-08-30 VITALS — Ht 70.0 in | Wt 261.0 lb

## 2022-08-30 DIAGNOSIS — Z86010 Personal history of colonic polyps: Secondary | ICD-10-CM

## 2022-08-30 MED ORDER — NA SULFATE-K SULFATE-MG SULF 17.5-3.13-1.6 GM/177ML PO SOLN: 1.0000 | Freq: Once | ORAL | 0 refills | Status: AC

## 2022-08-30 NOTE — Progress Notes (Signed)

## 2022-09-05 ENCOUNTER — Other Ambulatory Visit: Payer: Self-pay | Admitting: Family Medicine

## 2022-09-11 ENCOUNTER — Encounter: Payer: Self-pay | Admitting: Certified Registered Nurse Anesthetist

## 2022-09-13 ENCOUNTER — Encounter: Payer: Self-pay | Admitting: Gastroenterology

## 2022-09-16 ENCOUNTER — Encounter: Payer: Self-pay | Admitting: Gastroenterology

## 2022-09-16 ENCOUNTER — Ambulatory Visit (AMBULATORY_SURGERY_CENTER): Payer: Managed Care, Other (non HMO) | Admitting: Gastroenterology

## 2022-09-16 VITALS — BP 131/71 | HR 50 | Temp 98.6°F | Resp 12 | Ht 70.0 in | Wt 261.0 lb

## 2022-09-16 DIAGNOSIS — Z09 Encounter for follow-up examination after completed treatment for conditions other than malignant neoplasm: Secondary | ICD-10-CM | POA: Diagnosis present

## 2022-09-16 DIAGNOSIS — D122 Benign neoplasm of ascending colon: Secondary | ICD-10-CM | POA: Diagnosis not present

## 2022-09-16 DIAGNOSIS — Z86010 Personal history of colonic polyps: Secondary | ICD-10-CM

## 2022-09-16 MED ORDER — SODIUM CHLORIDE 0.9 % IV SOLN: 500.0000 mL | Freq: Once | INTRAVENOUS | Status: DC

## 2022-09-16 NOTE — Progress Notes (Signed)
History and Physical:  This patient presents for endoscopic testing for: Encounter Diagnosis  Name Primary?   Personal history of colonic polyps Yes    May 2018 - diminutive tubular adenoma x 2 Patient denies chronic abdominal pain, rectal bleeding, constipation or diarrhea.   Patient is otherwise without complaints or active issues today.   Past Medical History: Past Medical History:  Diagnosis Date   Anxiety    Arthritis    osteoarthritis   Cancer ([place])    Skin cancer   Chicken pox    Colon polyp    Diabetes mellitus, type 2 ([place]) 06/30/2011   controlled with diet/no meds. per pt.   ED (erectile dysfunction)    GERD (gastroesophageal reflux disease)    Hyperlipidemia    Obesity    Peyronie disease    Unspecified essential hypertension 06/30/2011     Past Surgical History: Past Surgical History:  Procedure Laterality Date   COLONOSCOPY  10 years ago?   done in [facility], pt will try to find records.   POLYPECTOMY  10 years ago ?   SKIN TAG REMOVAL     various removal from skin   TONSILLECTOMY  1093   UMBILICAL HERNIA REPAIR  08/2016   VASECTOMY      Allergies: No Known Allergies  Outpatient Meds: Current Outpatient Medications  Medication Sig Dispense Refill   aspirin 81 MG tablet Take 81 mg by mouth daily.     glucosamine-chondroitin 500-400 MG tablet Take 1 tablet by mouth daily.     KRILL OIL PO Take 500 mg by mouth.     lisinopril (ZESTRIL) 10 MG tablet TAKE 1 TABLET DAILY 90 tablet 3   Multiple Vitamin (MULTIVITAMIN) tablet Take 1 tablet by mouth daily.     OVER THE COUNTER MEDICATION 500 mg daily. L-LYSINE     pantoprazole (PROTONIX) 40 MG tablet TAKE 1 TABLET DAILY 90 tablet 3   rosuvastatin (CRESTOR) 20 MG tablet TAKE 1 TABLET DAILY (REPLACES SIMVASTATIN) 90 tablet 3   vitamin B-12 (CYANOCOBALAMIN) 1000 MCG tablet Take 1,000 mcg by mouth daily.     vitamin C (ASCORBIC ACID) 500 MG tablet Take 500 mg by mouth daily.     naproxen (NAPROSYN) 500 MG tablet  500 mg.     triamcinolone cream (KENALOG) 0.1 % Apply 1 application. topically 2 (two) times daily. (Patient taking differently: Apply 1 application  topically as needed.) 80 g 1   Current Facility-Administered Medications  Medication Dose Route Frequency Provider Last Rate Last Admin   0.9 %  sodium chloride infusion  500 mL Intravenous Once Danis, Estill Cotta III, MD          ___________________________________________________________________ Objective   Exam:  BP (!) 156/80   Pulse (!) 53   Temp 98.6 F (37 C) (Temporal)   Ht '5\' 10"'$  (1.778 m)   Wt 261 lb (118.4 kg)   SpO2 97%   BMI 37.45 kg/m   CV: regular , S1/S2 Resp: clear to auscultation bilaterally, normal RR and effort noted GI: soft, no tenderness, with active bowel sounds.   Assessment: Encounter Diagnosis  Name Primary?   Personal history of colonic polyps Yes     Plan: Colonoscopy  The benefits and risks of the planned procedure were described in detail with the patient or (when appropriate) their health care proxy.  Risks were outlined as including, but not limited to, bleeding, infection, perforation, adverse medication reaction leading to cardiac or pulmonary decompensation, pancreatitis (if ERCP).  The limitation  of incomplete mucosal visualization was also discussed.  No guarantees or warranties were given.    The patient is appropriate for an endoscopic procedure in the ambulatory setting.   - Wilfrid Lund, MD

## 2022-09-16 NOTE — Progress Notes (Signed)
Called to room to assist during endoscopic procedure.  Patient ID and intended procedure confirmed with present staff. Received instructions for my participation in the procedure from the performing physician.  

## 2022-09-16 NOTE — Progress Notes (Signed)
Pt's states no medical or surgical changes since previsit or office visit. 

## 2022-09-16 NOTE — Patient Instructions (Signed)
Handouts on diverticulosis and polyps given to patient.  Await pathology results. Repeat colonoscopy for surveillance will be determined based off of pathology results.   YOU HAD AN ENDOSCOPIC PROCEDURE TODAY AT [facility]:   Refer to the procedure report that was given to you for any specific questions about what was found during the examination.  If the procedure report does not answer your questions, please call your gastroenterologist to clarify.  If you requested that your care partner not be given the details of your procedure findings, then the procedure report has been included in a sealed envelope for you to review at your convenience later.  YOU SHOULD EXPECT: Some feelings of bloating in the abdomen. Passage of more gas than usual.  Walking can help get rid of the air that was put into your GI tract during the procedure and reduce the bloating. If you had a lower endoscopy (such as a colonoscopy or flexible sigmoidoscopy) you may notice spotting of blood in your stool or on the toilet paper. If you underwent a bowel prep for your procedure, you may not have a normal bowel movement for a few days.  Please Note:  You might notice some irritation and congestion in your nose or some drainage.  This is from the oxygen used during your procedure.  There is no need for concern and it should clear up in a day or so.  SYMPTOMS TO REPORT IMMEDIATELY:  Following lower endoscopy (colonoscopy or flexible sigmoidoscopy):  Excessive amounts of blood in the stool  Significant tenderness or worsening of abdominal pains  Swelling of the abdomen that is new, acute  Fever of 100F or higher  For urgent or emergent issues, a gastroenterologist can be reached at any hour by calling [phone]. Do not use MyChart messaging for urgent concerns.    DIET:  We do recommend a small meal at first, but then you may proceed to your regular diet.  Drink plenty of fluids but you should avoid  alcoholic beverages for 24 hours.  ACTIVITY:  You should plan to take it easy for the rest of today and you should NOT DRIVE or use heavy machinery until tomorrow (because of the sedation medicines used during the test).    FOLLOW UP: Our staff will call the number listed on your records the next business day following your procedure.  We will call around 7:15- 8:00 am to check on you and address any questions or concerns that you may have regarding the information given to you following your procedure. If we do not reach you, we will leave a message.     If any biopsies were taken you will be contacted by phone or by letter within the next 1-3 weeks.  Please call us at [phone] if you have not heard about the biopsies in 3 weeks.    SIGNATURES/CONFIDENTIALITY: You and/or your care partner have signed paperwork which will be entered into your electronic medical record.  These signatures attest to the fact that that the information above on your After Visit Summary has been reviewed and is understood.  Full responsibility of the confidentiality of this discharge information lies with you and/or your care-partner.

## 2022-09-16 NOTE — Progress Notes (Signed)
Report given to PACU, vss 

## 2022-09-16 NOTE — Op Note (Signed)
[facility] Patient Name: Tony Garza Procedure Date: 09/16/2022 7:19 AM MRN: [id number] Endoscopist: Mallie Mussel L. Loletha Carrow , MD, [id number] Age: 66 Referring MD:  Date of Birth: 10/10/1956 Gender: Male Account #: [id number] Procedure:                Colonoscopy Indications:              Surveillance: Personal history of adenomatous                            polyps on last colonoscopy > 5 years ago Medicines:                Monitored Anesthesia Care Procedure:                Pre-Anesthesia Assessment:                           - Prior to the procedure, a History and Physical                            was performed, and patient medications and                            allergies were reviewed. The patient's tolerance of                            previous anesthesia was also reviewed. The risks                            and benefits of the procedure and the sedation                            options and risks were discussed with the patient.                            All questions were answered, and informed consent                            was obtained. Prior Anticoagulants: The patient has                            taken no anticoagulant or antiplatelet agents. ASA                            Grade Assessment: II - A patient with mild systemic                            disease. After reviewing the risks and benefits,                            the patient was deemed in satisfactory condition to                            undergo the procedure.  After obtaining informed consent, the colonoscope                            was passed under direct vision. Throughout the                            procedure, the patient's blood pressure, pulse, and                            oxygen saturations were monitored continuously. The                            CF [id number] #[id number] was introduced through the anus                            and advanced to  the the cecum, identified by                            appendiceal orifice and ileocecal valve. The                            colonoscopy was performed without difficulty. The                            patient tolerated the procedure well. The quality                            of the bowel preparation was excellent. The                            ileocecal valve, appendiceal orifice, and rectum                            were photographed. Scope In: 7:57:58 AM Scope Out: 8:11:30 AM Scope Withdrawal Time: 0 hours 10 minutes 44 seconds  Total Procedure Duration: 0 hours 13 minutes 32 seconds  Findings:                 The perianal and digital rectal examinations were                            normal.                           Repeat examination of right colon under NBI                            performed.                           A diminutive polyp was found in the ascending                            colon. The polyp was semi-sessile. The polyp was  removed with a cold snare. Resection and retrieval                            were complete.                           Multiple small-mouthed diverticula were found from                            transverse colon to sigmoid colon.                           The exam was otherwise without abnormality on                            direct and retroflexion views. Complications:            No immediate complications. Estimated Blood Loss:     Estimated blood loss was minimal. Impression:               - One diminutive polyp in the ascending colon,                            removed with a cold snare. Resected and retrieved.                           - Diverticulosis from transverse colon to sigmoid                            colon.                           - The examination was otherwise normal on direct                            and retroflexion views. Recommendation:           - Patient has a contact number  available for                            emergencies. The signs and symptoms of potential                            delayed complications were discussed with the                            patient. Return to normal activities tomorrow.                            Written discharge instructions were provided to the                            patient.                           - Resume previous diet.                           -  Continue present medications.                           - Await pathology results.                           - Repeat colonoscopy is recommended for                            surveillance. The colonoscopy date will be                            determined after pathology results from today's                            exam become available for review. Tony Garza L. Loletha Carrow, MD 09/16/2022 8:15:07 AM This report has been signed electronically.

## 2022-09-19 ENCOUNTER — Telehealth: Payer: Self-pay | Admitting: *Deleted

## 2022-09-19 NOTE — Telephone Encounter (Signed)
  Follow up Call-     09/16/2022    7:18 AM  Call back number  Post procedure Call Back phone  # [phone]  Permission to leave phone message Yes     Patient questions:  Do you have a fever, pain , or abdominal swelling? No. Pain Score  0 *  Have you tolerated food without any problems? Yes.    Have you been able to return to your normal activities? Yes.    Do you have any questions about your discharge instructions: Diet   No. Medications  No. Follow up visit  No.  Do you have questions or concerns about your Care? No.  Actions: * If pain score is 4 or above: No action needed, pain <4.

## 2022-09-20 ENCOUNTER — Encounter: Payer: Self-pay | Admitting: Gastroenterology

## 2023-01-10 ENCOUNTER — Telehealth: Payer: Managed Care, Other (non HMO) | Admitting: Physician Assistant

## 2023-01-10 ENCOUNTER — Telehealth: Payer: Self-pay | Admitting: Family Medicine

## 2023-01-10 DIAGNOSIS — U071 COVID-19: Secondary | ICD-10-CM | POA: Diagnosis not present

## 2023-01-10 MED ORDER — NIRMATRELVIR/RITONAVIR (PAXLOVID)TABLET: 3.0000 | ORAL_TABLET | Freq: Two times a day (BID) | ORAL | 0 refills | Status: AC

## 2023-01-10 MED ORDER — BENZONATATE 100 MG PO CAPS: 100.0000 mg | ORAL_CAPSULE | Freq: Three times a day (TID) | ORAL | 0 refills | Status: DC | PRN

## 2023-01-10 NOTE — Progress Notes (Signed)
Virtual Visit Consent   Tony Garza, you are scheduled for a virtual visit with a [facility] provider today. Just as with appointments in the office, your consent must be obtained to participate. Your consent will be active for this visit and any virtual visit you may have with one of our providers in the next 365 days. If you have a MyChart account, a copy of this consent can be sent to you electronically.  As this is a virtual visit, video technology does not allow for your provider to perform a traditional examination. This may limit your provider's ability to fully assess your condition. If your provider identifies any concerns that need to be evaluated in person or the need to arrange testing (such as labs, EKG, etc.), we will make arrangements to do so. Although advances in technology are sophisticated, we cannot ensure that it will always work on either your end or our end. If the connection with a video visit is poor, the visit may have to be switched to a telephone visit. With either a video or telephone visit, we are not always able to ensure that we have a secure connection.  By engaging in this virtual visit, you consent to the provision of healthcare and authorize for your insurance to be billed (if applicable) for the services provided during this visit. Depending on your insurance coverage, you may receive a charge related to this service.  I need to obtain your verbal consent now. Are you willing to proceed with your visit today? Tony Garza has provided verbal consent on 01/10/2023 for a virtual visit (video or telephone). [name], [place]  Date: 01/10/2023 12:12 PM  Virtual Visit via Video Note   I, [name], connected with  Tony Garza  ([id number], May 09, 1956) on 01/10/23 at 12:15 PM EDT by a video-enabled telemedicine application and verified that I am speaking with the correct person using two identifiers.  Location: Patient: Virtual  Visit Location Patient: Home Provider: Virtual Visit Location Provider: Home Office   I discussed the limitations of evaluation and management by telemedicine and the availability of in person appointments. The patient expressed understanding and agreed to proceed.    History of Present Illness: Tony Garza is a 67 y.o. who identifies as a male who was assigned male at birth, and is being seen today for COVID-19. Patient endorses testing positive for COVID on home testing after wife tested positive in office. He had symptoms starting on Saturday with nasal and head congestion, drainage, cough and fatigue. Some sinus tenderness. Denies fever, chills, chest pain.  Denies GI symptoms.   OTC -- Alka Seltzer Cold and Plus.   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   Skin tag 02/06/2015   Erectile dysfunction associated with type 2 diabetes mellitus 01/12/2015   Eczema 06/18/2014   Fungal dermatitis 06/18/2014   Routine general medical examination at a health care facility 07/16/2012   Neoplasm of uncertain behavior of skin 03/01/2012   Diabetes mellitus, type 2 06/30/2011   Essential hypertension 06/30/2011   OTHER ANXIETY STATES 06/17/2010   Morbid obesity 09/07/2009   GERD 11/03/2008   Hyperlipidemia associated with type 2 diabetes mellitus 07/28/2008   OSTEOARTHRITIS 07/28/2008   IMPOTENCE 07/25/2008   ALCOHOL USE 07/25/2008   PEYRONIE'S DISEASE, 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 L-LYSINE, Disp: , Rfl:    pantoprazole (PROTONIX) 40 MG tablet, TAKE 1 TABLET DAILY, Disp: 90 tablet, Rfl: 3   rosuvastatin (CRESTOR) 20 MG tablet, TAKE 1 TABLET DAILY (REPLACES SIMVASTATIN), Disp: 90 tablet, Rfl: 3   triamcinolone cream (KENALOG) 0.1 %, Apply 1 application. topically 2 (two) times daily. (Patient taking differently: Apply 1 application  topically as needed.), Disp: 80 g, Rfl: 1   vitamin B-12 (CYANOCOBALAMIN) 1000 MCG tablet, Take 1,000 mcg by mouth daily., Disp: , Rfl:    vitamin C (ASCORBIC ACID) 500 MG tablet, Take 500 mg by mouth daily., Disp: , Rfl:   Observations/Objective: Patient is well-developed, well-nourished in no acute distress.  Resting comfortably at home.  Head is normocephalic, atraumatic.  No labored breathing. Speech is clear and coherent with logical content.  Patient is alert and oriented at baseline.   Assessment and Plan: 1. COVID-19  Patient with multiple risk factors for complicated course of illness. Discussed risks/benefits of antiviral medications including most common potential ADRs. Patient voiced understanding and would like to proceed with antiviral medication. They are candidate for Paxlovid (GFR > 66 on recent testing). Will hold statin. Rx sent to pharmacy. Supportive measures, OTC medications and vitamin regimen reviewed. Tessalon per orders. Quarantine reviewed in detail. Strict ER precautions discussed with patient.    Follow Up Instructions: I discussed the assessment and treatment plan with the patient. The patient was provided an opportunity to ask questions and all were answered. The patient agreed with the plan and demonstrated an understanding of the instructions.  A copy of instructions were sent to the patient via MyChart unless otherwise noted below.   The patient was advised to call back or seek an in-person  evaluation if the symptoms worsen or if the condition fails to improve as anticipated.  Time:  I spent 10 minutes with the patient via telehealth technology discussing the above problems/concerns.    [name], PA-C

## 2023-01-10 NOTE — Addendum Note (Signed)
Addended by: Brunetta Jeans on: 01/10/2023 02:29 PM   Modules accepted: Orders

## 2023-01-10 NOTE — Telephone Encounter (Signed)
Caller name: Ranald Batz  On DPR?: Yes  Call back number: [phone] (mobile)  Provider they see: Midge Minium, MD  Reason for call:Pt tested Covid + was told to call because spouse was here and tested +.

## 2023-01-10 NOTE — Telephone Encounter (Signed)
Patient's wife called to get a updated on her husband medication ( Paxlovid).  Patient had a e-visit with [facility] earlier today. Tony Garza supposed to be sending in Paxlovid for this patient. Patient  pharmacy is    [id number] - [place], [place] - [address] AT [address]

## 2023-01-10 NOTE — Patient Instructions (Signed)
Tony Garza, thank you for joining Tony Rio, PA-C for today's virtual visit.  While this provider is not your primary care provider (PCP), if your PCP is located in our provider database this encounter information will be shared with them immediately following your visit.   [facility] account gives you access to today's visit and all your visits, tests, and labs performed at [facility] " click here if you don't have a [facility] account or go to [email or web address]  Consent: (Patient) Tony Garza provided verbal consent for this virtual visit at the beginning of the encounter.  Current Medications:  Current Outpatient Medications:    aspirin 81 MG tablet, Take 81 mg by mouth daily., Disp: , Rfl:    glucosamine-chondroitin 500-400 MG tablet, Take 1 tablet by mouth daily., Disp: , Rfl:    KRILL OIL PO, Take 500 mg by mouth., Disp: , Rfl:    lisinopril (ZESTRIL) 10 MG tablet, TAKE 1 TABLET DAILY, Disp: 90 tablet, Rfl: 3   Multiple Vitamin (MULTIVITAMIN) tablet, Take 1 tablet by mouth daily., Disp: , Rfl:    naproxen (NAPROSYN) 500 MG tablet, 500 mg., Disp: , Rfl:    OVER THE COUNTER MEDICATION, 500 mg daily. L-LYSINE, Disp: , Rfl:    pantoprazole (PROTONIX) 40 MG tablet, TAKE 1 TABLET DAILY, Disp: 90 tablet, Rfl: 3   rosuvastatin (CRESTOR) 20 MG tablet, TAKE 1 TABLET DAILY (REPLACES SIMVASTATIN), Disp: 90 tablet, Rfl: 3   triamcinolone cream (KENALOG) 0.1 %, Apply 1 application. topically 2 (two) times daily. (Patient taking differently: Apply 1 application  topically as needed.), Disp: 80 g, Rfl: 1   vitamin B-12 (CYANOCOBALAMIN) 1000 MCG tablet, Take 1,000 mcg by mouth daily., Disp: , Rfl:    vitamin C (ASCORBIC ACID) 500 MG tablet, Take 500 mg by mouth daily., Disp: , Rfl:    Medications ordered in this encounter:  No orders of the defined types were placed in this encounter.    *If you need refills on other medications  prior to your next appointment, please contact your pharmacy*  Follow-Up: Call back or seek an in-person evaluation if the symptoms worsen or if the condition fails to improve as anticipated.  [facility] [phone]  Care Instructions: Please keep hydrated and rest. Start a saline nasal rinse.   Ok to use OTC Mucinex.  Start the Paxlovid as directed, making sure to hold off on taking your rosuvastatin while on the Paxlovid and for 5 additional days.   Isolation Instructions: You are to isolate at home until you have been fever free for at least 24 hours without a fever-reducing medication, and symptoms have been steadily improving for 24 hours. At that time,  you can end isolation but need to mask for an additional 5 days.   If you must be around other household members who do not have symptoms, you need to make sure that both you and the family members are masking consistently with a high-quality mask.  If you note any worsening of symptoms despite treatment, please seek an in-person evaluation ASAP. If you note any significant shortness of breath or any chest pain, please seek ER evaluation. Please do not delay care!   COVID-19: What to Do if You Are Sick If you test positive and are an older adult or someone who is at high risk of getting very sick from COVID-19, treatment may be available. Contact a healthcare provider right away after a positive test to determine  if you are eligible, even if your symptoms are mild right now. You can also visit a Test to Treat location and, if eligible, receive a prescription from a provider. Don't delay: Treatment must be started within the first few days to be effective. If you have a fever, cough, or other symptoms, you might have COVID-19. Most people have mild illness and are able to recover at home. If you are sick: Keep track of your symptoms. If you have an emergency warning sign (including trouble breathing), call 911. Steps to  help prevent the spread of COVID-19 if you are sick If you are sick with COVID-19 or think you might have COVID-19, follow the steps below to care for yourself and to help protect other people in your home and community. Stay home except to get medical care Stay home. Most people with COVID-19 have mild illness and can recover at home without medical care. Do not leave your home, except to get medical care. Do not visit public areas and do not go to places where you are unable to wear a mask. Take care of yourself. Get rest and stay hydrated. Take over-the-counter medicines, such as acetaminophen, to help you feel better. Stay in touch with your doctor. Call before you get medical care. Be sure to get care if you have trouble breathing, or have any other emergency warning signs, or if you think it is an emergency. Avoid public transportation, ride-sharing, or taxis if possible. Get tested If you have symptoms of COVID-19, get tested. While waiting for test results, stay away from others, including staying apart from those living in your household. Get tested as soon as possible after your symptoms start. Treatments may be available for people with COVID-19 who are at risk for becoming very sick. Don't delay: Treatment must be started early to be effective--some treatments must begin within 5 days of your first symptoms. Contact your healthcare provider right away if your test result is positive to determine if you are eligible. Self-tests are one of several options for testing for the virus that causes COVID-19 and may be more convenient than laboratory-based tests and point-of-care tests. Ask your healthcare provider or your local health department if you need help interpreting your test results. You can visit your state, tribal, local, and territorial health department's website to look for the latest local information on testing sites. Separate yourself from other people As much as possible, stay in  a specific room and away from other people and pets in your home. If possible, you should use a separate bathroom. If you need to be around other people or animals in or outside of the home, wear a well-fitting mask. Tell your close contacts that they may have been exposed to COVID-19. An infected person can spread COVID-19 starting 48 hours (or 2 days) before the person has any symptoms or tests positive. By letting your close contacts know they may have been exposed to COVID-19, you are helping to protect everyone. See COVID-19 and Animals if you have questions about pets. If you are diagnosed with COVID-19, someone from the health department may call you. Answer the call to slow the spread. Monitor your symptoms Symptoms of COVID-19 include fever, cough, or other symptoms. Follow care instructions from your healthcare provider and local health department. Your local health authorities may give instructions on checking your symptoms and reporting information. When to seek emergency medical attention Look for emergency warning signs* for COVID-19. If someone is showing any of  these signs, seek emergency medical care immediately: Trouble breathing Persistent pain or pressure in the chest New confusion Inability to wake or stay awake Pale, gray, or blue-colored skin, lips, or nail beds, depending on skin tone *This list is not all possible symptoms. Please call your medical provider for any other symptoms that are severe or concerning to you. Call 911 or call ahead to your local emergency facility: Notify the operator that you are seeking care for someone who has or may have COVID-19. Call ahead before visiting your doctor Call ahead. Many medical visits for routine care are being postponed or done by phone or telemedicine. If you have a medical appointment that cannot be postponed, call your doctor's office, and tell them you have or may have COVID-19. This will help the office protect themselves  and other patients. If you are sick, wear a well-fitting mask You should wear a mask if you must be around other people or animals, including pets (even at home). Wear a mask with the best fit, protection, and comfort for you. You don't need to wear the mask if you are alone. If you can't put on a mask (because of trouble breathing, for example), cover your coughs and sneezes in some other way. Try to stay at least 6 feet away from other people. This will help protect the people around you. Masks should not be placed on young children under age 54 years, anyone who has trouble breathing, or anyone who is not able to remove the mask without help. Cover your coughs and sneezes Cover your mouth and nose with a tissue when you cough or sneeze. Throw away used tissues in a lined trash can. Immediately wash your hands with soap and water for at least 20 seconds. If soap and water are not available, clean your hands with an alcohol-based hand sanitizer that contains at least 60% alcohol. Clean your hands often Wash your hands often with soap and water for at least 20 seconds. This is especially important after blowing your nose, coughing, or sneezing; going to the bathroom; and before eating or preparing food. Use hand sanitizer if soap and water are not available. Use an alcohol-based hand sanitizer with at least 60% alcohol, covering all surfaces of your hands and rubbing them together until they feel dry. Soap and water are the best option, especially if hands are visibly dirty. Avoid touching your eyes, nose, and mouth with unwashed hands. Handwashing Tips Avoid sharing personal household items Do not share dishes, drinking glasses, cups, eating utensils, towels, or bedding with other people in your home. Wash these items thoroughly after using them with soap and water or put in the dishwasher. Clean surfaces in your home regularly Clean and disinfect high-touch surfaces (for example, doorknobs,  tables, handles, light switches, and countertops) in your "sick room" and bathroom. In shared spaces, you should clean and disinfect surfaces and items after each use by the person who is ill. If you are sick and cannot clean, a caregiver or other person should only clean and disinfect the area around you (such as your bedroom and bathroom) on an as needed basis. Your caregiver/other person should wait as long as possible (at least several hours) and wear a mask before entering, cleaning, and disinfecting shared spaces that you use. Clean and disinfect areas that may have blood, stool, or body fluids on them. Use household cleaners and disinfectants. Clean visible dirty surfaces with household cleaners containing soap or detergent. Then, use a household  disinfectant. Use a product from [employer] List N: Disinfectants for Coronavirus ([id number]). Be sure to follow the instructions on the label to ensure safe and effective use of the product. Many products recommend keeping the surface wet with a disinfectant for a certain period of time (look at "contact time" on the product label). You may also need to wear personal protective equipment, such as gloves, depending on the directions on the product label. Immediately after disinfecting, wash your hands with soap and water for 20 seconds. For completed guidance on cleaning and disinfecting your home, visit Complete Disinfection Guidance. Take steps to improve ventilation at home Improve ventilation (air flow) at home to help prevent from spreading COVID-19 to other people in your household. Clear out COVID-19 virus particles in the air by opening windows, using air filters, and turning on fans in your home. Use this interactive tool to learn how to improve air flow in your home. When you can be around others after being sick with COVID-19 Deciding when you can be around others is different for different situations. Find out when you can safely end home  isolation. For any additional questions about your care, contact your healthcare provider or state or local health department. 12/29/2020 Content source: [facility] ([facility]), Division of Viral Diseases This information is not intended to replace advice given to you by your health care provider. Make sure you discuss any questions you have with your health care provider. Document Revised: 02/11/2021 Document Reviewed: 02/11/2021 Elsevier Patient Education  2022 [employer].  If you have been instructed to have an in-person evaluation today at a local Urgent Care facility, please use the link below. It will take you to a list of all of our available [facility], including address, phone number and hours of operation. Please do not delay care.  [facility]  If you or a family member do not have a primary care provider, use the link below to schedule a visit and establish care. When you choose a [facility] primary care physician or advanced practice provider, you gain a long-term partner in health. Find a Primary Care Provider  Learn more about [facility]'s in-office and virtual care options: [facility]

## 2023-01-10 NOTE — Telephone Encounter (Signed)
Medications have been resubmitted to pharmacy -- Paxlovid and Tessalon. Thank you for sending this message to me so we could get it taken care of.

## 2023-01-10 NOTE — Telephone Encounter (Signed)
Pt has a telehealth visit at 12 15 pm . Pt took an @ home COVID test and is positive as well.

## 2023-01-10 NOTE — Telephone Encounter (Signed)
You're welcome. I called patient wife, she is aware that the medications has been sent.

## 2023-03-03 ENCOUNTER — Other Ambulatory Visit: Payer: Self-pay | Admitting: Family Medicine

## 2023-03-03 DIAGNOSIS — K219 Gastro-esophageal reflux disease without esophagitis: Secondary | ICD-10-CM

## 2023-07-18 ENCOUNTER — Encounter: Payer: Self-pay | Admitting: Family Medicine

## 2023-07-18 ENCOUNTER — Ambulatory Visit: Payer: Managed Care, Other (non HMO) | Admitting: Family Medicine

## 2023-07-18 VITALS — BP 128/62 | HR 63 | Temp 97.9°F | Ht 70.0 in | Wt 258.6 lb

## 2023-07-18 DIAGNOSIS — E119 Type 2 diabetes mellitus without complications: Secondary | ICD-10-CM

## 2023-07-18 DIAGNOSIS — E1169 Type 2 diabetes mellitus with other specified complication: Secondary | ICD-10-CM | POA: Diagnosis not present

## 2023-07-18 DIAGNOSIS — J309 Allergic rhinitis, unspecified: Secondary | ICD-10-CM | POA: Insufficient documentation

## 2023-07-18 DIAGNOSIS — E785 Hyperlipidemia, unspecified: Secondary | ICD-10-CM | POA: Diagnosis not present

## 2023-07-18 DIAGNOSIS — J301 Allergic rhinitis due to pollen: Secondary | ICD-10-CM

## 2023-07-18 LAB — CBC WITH DIFFERENTIAL/PLATELET
Basophils Absolute: 0.1 10*3/uL (ref 0.0–0.1)
Basophils Relative: 0.8 % (ref 0.0–3.0)
Eosinophils Absolute: 0.2 10*3/uL (ref 0.0–0.7)
Eosinophils Relative: 3.4 % (ref 0.0–5.0)
HCT: 44.7 % (ref 39.0–52.0)
Hemoglobin: 14.6 g/dL (ref 13.0–17.0)
Lymphocytes Relative: 28.1 % (ref 12.0–46.0)
Lymphs Abs: 1.9 10*3/uL (ref 0.7–4.0)
MCHC: 32.7 g/dL (ref 30.0–36.0)
MCV: 96.1 fL (ref 78.0–100.0)
Monocytes Absolute: 0.5 10*3/uL (ref 0.1–1.0)
Monocytes Relative: 7.3 % (ref 3.0–12.0)
Neutro Abs: 4 10*3/uL (ref 1.4–7.7)
Neutrophils Relative %: 60.4 % (ref 43.0–77.0)
Platelets: 282 10*3/uL (ref 150.0–400.0)
RBC: 4.65 Mil/uL (ref 4.22–5.81)
RDW: 13.1 % (ref 11.5–15.5)
WBC: 6.7 10*3/uL (ref 4.0–10.5)

## 2023-07-18 LAB — LIPID PANEL
Cholesterol: 206 mg/dL — ABNORMAL HIGH (ref 0–200)
HDL: 74.4 mg/dL (ref >39.00)
LDL Cholesterol: 102 mg/dL — ABNORMAL HIGH (ref 0–99)
NonHDL: 132
Total CHOL/HDL Ratio: 3
Triglycerides: 148 mg/dL (ref 0.0–149.0)
VLDL: 29.6 mg/dL (ref 0.0–40.0)

## 2023-07-18 LAB — BASIC METABOLIC PANEL
BUN: 11 mg/dL (ref 6–23)
CO2: 31 meq/L (ref 19–32)
Calcium: 9.6 mg/dL (ref 8.4–10.5)
Chloride: 100 meq/L (ref 96–112)
Creatinine, Ser: 1.08 mg/dL (ref 0.40–1.50)
GFR: 70.23 mL/min (ref >60.00)
Glucose, Bld: 232 mg/dL — ABNORMAL HIGH (ref 70–99)
Potassium: 4.6 meq/L (ref 3.5–5.1)
Sodium: 140 meq/L (ref 135–145)

## 2023-07-18 LAB — HEPATIC FUNCTION PANEL
ALT: 18 U/L (ref 0–53)
AST: 18 U/L (ref 0–37)
Albumin: 4.3 g/dL (ref 3.5–5.2)
Alkaline Phosphatase: 69 U/L (ref 39–117)
Bilirubin, Direct: 0.1 mg/dL (ref 0.0–0.3)
Total Bilirubin: 0.5 mg/dL (ref 0.2–1.2)
Total Protein: 6.9 g/dL (ref 6.0–8.3)

## 2023-07-18 LAB — TSH: TSH: 1.08 u[IU]/mL (ref 0.35–5.50)

## 2023-07-18 LAB — HEMOGLOBIN A1C: Hgb A1c MFr Bld: 7.5 % — ABNORMAL HIGH (ref 4.6–6.5)

## 2023-07-18 MED ORDER — GUAIFENESIN-CODEINE 100-10 MG/5ML PO SYRP
10.0000 mL | ORAL_SOLUTION | Freq: Three times a day (TID) | ORAL | 0 refills | Status: DC | PRN
Start: 2023-07-18 — End: 2024-01-18

## 2023-07-18 MED ORDER — AZELASTINE-FLUTICASONE 137-50 MCG/ACT NA SUSP: 1.0000 | Freq: Two times a day (BID) | NASAL | 1 refills | Status: DC

## 2023-07-18 MED ORDER — CETIRIZINE HCL 10 MG PO TABS: 10.0000 mg | ORAL_TABLET | Freq: Every day | ORAL | 11 refills | Status: AC

## 2023-07-18 NOTE — Progress Notes (Signed)
   Subjective:    Patient ID: Tony Garza, male    DOB: Dec 25, 1955, 67 y.o.   MRN: [id number]  HPI Cough- 'my normal fall stuff'.  Sxs started ~2 weeks ago.  No fever.  No body aches.  + HA.  + sinus congestion, no facial pain.  Bilateral ear fullness.  Cough is intermittently productive.  Worse at night.  Pt reports during the day 'i feel halfway decent'.  Not taking any allergy medication at this time.  Taking Alka-seltzer cold  DM- chronic problem, currently diet controlled.  On ACE for renal protection.  UTD on eye exam, foot exam.  Due for A1C and microalbumin.  No CP, SOB, visual changes  Hyperlipidemia- chronic problem, on Crestor 20mg  daily.  No abd pain, N/V   Review of Systems For ROS see HPI     Objective:   Physical Exam Vitals reviewed.  Constitutional:      General: He is not in acute distress.    Appearance: Normal appearance. He is well-developed. He is obese. He is not ill-appearing.  HENT:     Head: Normocephalic and atraumatic.     Right Ear: Ear canal normal. A middle ear effusion is present. Tympanic membrane is not erythematous.     Left Ear: Ear canal normal. A middle ear effusion is present. Tympanic membrane is not erythematous.     Nose: Congestion present.     Comments: No TTP over frontal or maxillary sinuses    Mouth/Throat:     Mouth: Mucous membranes are moist.     Pharynx: No oropharyngeal exudate or posterior oropharyngeal erythema.     Comments: Copious PND Eyes:     Conjunctiva/sclera: Conjunctivae normal.     Pupils: Pupils are equal, round, and reactive to light.  Cardiovascular:     Rate and Rhythm: Normal rate and regular rhythm.     Heart sounds: Normal heart sounds.  Pulmonary:     Effort: Pulmonary effort is normal. No respiratory distress.     Breath sounds: Normal breath sounds. No wheezing.  Abdominal:     General: There is no distension.     Palpations: Abdomen is soft.     Tenderness: There is no abdominal tenderness.  There is no guarding or rebound.  Musculoskeletal:     Cervical back: Normal range of motion and neck supple.     Right lower leg: No edema.     Left lower leg: No edema.  Lymphadenopathy:     Cervical: No cervical adenopathy.  Skin:    General: Skin is warm and dry.  Neurological:     General: No focal deficit present.     Mental Status: He is alert and oriented to person, place, and time.  Psychiatric:        Mood and Affect: Mood normal.        Behavior: Behavior normal.        Thought Content: Thought content normal.           Assessment & Plan:

## 2023-07-18 NOTE — Assessment & Plan Note (Signed)
New.  Pt's 2 weeks of cough appears to be triggered by copious PND due to untreated allergies.  Start oral antihistamine, add Dymista.  No evidence of infection on PE- no need for abx.  Reviewed supportive care and red flags that should prompt return.  Pt expressed understanding and is in agreement w/ plan.

## 2023-07-18 NOTE — Patient Instructions (Signed)
Schedule your complete physical in 6 months We'll notify you of your lab results and make any changes if needed START the Cetirizine daily USE the nasal spray twice daily to decrease congestion, post nasal drip, and ear fullness TAKE the cough syrup before bed to help w/ sleep Drink LOTS of fluids to wash the drainage off the back of the throat Call with any questions or concerns Hang in there!!

## 2023-07-18 NOTE — Assessment & Plan Note (Signed)
Chronic problem.  Has been diet controlled.  Pt has not been seen for ~1 yr and is overdue for A1C and microalbumin.  UTD on eye exam and foot exam.  Check labs and determine if medication is needed

## 2023-07-18 NOTE — Assessment & Plan Note (Signed)
Chronic problem.  Currently on Crestor 20mg  daily w/o difficulty.  Check labs.  Adjust meds prn

## 2023-07-19 ENCOUNTER — Telehealth: Payer: Self-pay

## 2023-07-19 LAB — MICROALBUMIN / CREATININE URINE RATIO
Creatinine,U: 72.5 mg/dL
Microalb Creat Ratio: 1 mg/g (ref 0.0–30.0)
Microalb, Ur: <0.7 mg/dL (ref 0.0–1.9)

## 2023-07-19 NOTE — Telephone Encounter (Signed)
-----   Message from Neena Rhymes sent at 07/19/2023  7:48 AM EDT ----- Labs are stable.  This is good news.  Your A1C is creeping up over the last 2 yrs- from 6.9 --> 7.5%  I'm hoping to avoid medication but this will mean paying particular attention to a low carb diet and regular exercise.  We will recheck in 6 months and decide at that time if medication is needed.

## 2023-07-26 ENCOUNTER — Telehealth: Payer: Self-pay

## 2023-07-26 NOTE — Telephone Encounter (Signed)
Sent to Dr.Tabori.

## 2023-07-28 ENCOUNTER — Other Ambulatory Visit: Payer: Self-pay | Admitting: Family Medicine

## 2023-07-28 DIAGNOSIS — I10 Essential (primary) hypertension: Secondary | ICD-10-CM

## 2023-07-31 ENCOUNTER — Telehealth: Payer: Self-pay | Admitting: Pharmacy Technician

## 2023-07-31 ENCOUNTER — Other Ambulatory Visit (HOSPITAL_COMMUNITY): Payer: Self-pay

## 2023-07-31 NOTE — Telephone Encounter (Signed)
Pharmacy Patient Advocate Encounter  Received notification from [facility] that Prior Authorization for Azelastine-Fluticasone 137-50MCG/ACT suspension has been APPROVED from 07/31/2023 to 07/30/2024. Ran test claim, Copay is $10.00. This test claim was processed through [facility]- copay amounts may vary at other pharmacies due to pharmacy/plan contracts, or as the patient moves through the different stages of their insurance plan.   PA #/Case ID/Reference #: [id number] Key: [id number]

## 2023-08-11 LAB — HM DIABETES EYE EXAM

## 2023-08-30 ENCOUNTER — Other Ambulatory Visit: Payer: Self-pay | Admitting: Family Medicine

## 2023-09-04 ENCOUNTER — Encounter: Payer: Self-pay | Admitting: Family Medicine

## 2023-09-04 ENCOUNTER — Ambulatory Visit: Payer: Managed Care, Other (non HMO) | Admitting: Family Medicine

## 2023-09-04 VITALS — BP 138/78 | HR 88 | Temp 98.8°F | Ht 70.0 in | Wt 255.0 lb

## 2023-09-04 DIAGNOSIS — Z23 Encounter for immunization: Secondary | ICD-10-CM | POA: Diagnosis not present

## 2023-09-04 DIAGNOSIS — M5410 Radiculopathy, site unspecified: Secondary | ICD-10-CM | POA: Diagnosis not present

## 2023-09-04 MED ORDER — PREDNISONE 10 MG PO TABS: ORAL_TABLET | ORAL | 0 refills | Status: DC

## 2023-09-04 NOTE — Patient Instructions (Addendum)
Follow up as needed or as scheduled START the Prednisone as directed- 3 pills at the same time x3 days, then 2 pills at the same time x3 days, then 1 pill daily.  Take w/ food  ICE!!! If no better (or worse) please let me know!! Call with any questions or concerns Stay Safe!  Stay Healthy! Happy Holidays!!!

## 2023-09-04 NOTE — Progress Notes (Signed)
   Subjective:    Patient ID: Tony Garza, male    DOB: 12-Oct-1955, 67 y.o.   MRN: [id number]  HPI Leg numbness- 'i think it's just some sciatica of my L leg but it's a little different'.  Sxs 'hit me hard Saturday'.  Pt reports L hip was 'killing me'.  Pain radiated down L leg and developed numbness of L thigh and L foot.  Pain over lateral hip.  Pt reports depending on movement, will send shooting pain down leg 'like fire'.  Pt has been going to chiropractor monthly and has been doing fairly well.  No improvement w/ Aleve.  No known injury.     Review of Systems For ROS see HPI     Objective:   Physical Exam Vitals reviewed.  Constitutional:      General: He is not in acute distress.    Appearance: Normal appearance. He is obese. He is not ill-appearing.  Cardiovascular:     Pulses: Normal pulses.  Musculoskeletal:        General: Tenderness (TTP over L greater trochanter) present.     Right lower leg: No edema.     Left lower leg: No edema.  Skin:    General: Skin is warm and dry.  Neurological:     Mental Status: He is alert and oriented to person, place, and time.     Gait: Gait abnormal (antalgic gait).  Psychiatric:        Mood and Affect: Mood normal.        Behavior: Behavior normal.           Assessment & Plan:   Radicular leg pain- new.  Pt was exercising Saturday morning and then sat for multiple hours watching football.  Pain started when he attempted to get up.  Point TTP over L greater trochanter suggestive of trochanteric bursitis w/ possible nerve impingement to explain the radicular pain and numbness.  Will start Prednisone taper to improve pain, inflammation, and nerve irritation.  Cautioned him that with his DM he needs to be especially mindful of his carb/sugar intake.  Reviewed supportive care and red flags that should prompt return.  Pt expressed understanding and is in agreement w/ plan.

## 2023-11-24 ENCOUNTER — Ambulatory Visit: Payer: Managed Care, Other (non HMO) | Admitting: Family Medicine

## 2023-11-24 VITALS — BP 132/72 | HR 66 | Temp 98.7°F | Ht 70.0 in

## 2023-11-24 DIAGNOSIS — H6993 Unspecified Eustachian tube disorder, bilateral: Secondary | ICD-10-CM | POA: Diagnosis not present

## 2023-11-24 DIAGNOSIS — Z23 Encounter for immunization: Secondary | ICD-10-CM

## 2023-11-24 NOTE — Addendum Note (Signed)
Addended by: Ester Rink on: 11/24/2023 01:30 PM   Modules accepted: Orders

## 2023-11-24 NOTE — Patient Instructions (Signed)
Follow up as needed or as scheduled RESTART the nasal spray- 1 spray each nostril daily CONTINUE the Cetirizine daily ADD OTC decongestant like phenylephrine or sudafed (or in a combo pill like dayquil) Drink LOTS of fluids Call with any questions or concerns Hang in there!!!

## 2023-11-24 NOTE — Progress Notes (Signed)
   Subjective:    Patient ID: Tony Garza, male    DOB: 1956-07-05, 68 y.o.   MRN: [id number]  HPI Ear fullness- bilateral but L>R.  Sxs started 2 weeks ago.  Pt reports heavy cough and congestion has improved.  'these ears will not unplug'.  Taking Cetirizine daily.  Has Azelastine/Fluticasone spray but not using it.   Review of Systems For ROS see HPI     Objective:   Physical Exam Vitals reviewed.  Constitutional:      General: He is not in acute distress.    Appearance: Normal appearance. He is obese. He is not ill-appearing.  HENT:     Head: Normocephalic and atraumatic.     Right Ear: Ear canal normal. Tympanic membrane is retracted.     Left Ear: Ear canal normal. Tympanic membrane is retracted.     Nose: Congestion present. No rhinorrhea.  Eyes:     Extraocular Movements: Extraocular movements intact.     Conjunctiva/sclera: Conjunctivae normal.  Cardiovascular:     Rate and Rhythm: Normal rate and regular rhythm.  Pulmonary:     Effort: Pulmonary effort is normal. No respiratory distress.  Musculoskeletal:     Cervical back: Neck supple.  Lymphadenopathy:     Cervical: No cervical adenopathy.  Skin:    General: Skin is warm and dry.  Neurological:     General: No focal deficit present.     Mental Status: He is alert and oriented to person, place, and time.  Psychiatric:        Mood and Affect: Mood normal.        Behavior: Behavior normal.        Thought Content: Thought content normal.           Assessment & Plan:  Eustachian tube dysfxn- new.  Bilateral.  Pt likely had viral URI last week but has come through the worst of it.  Now w/ bilateral ear fullness.  No evidence of infxn.  Restart combination nasal spray.  Add OTC decongestant temporarily.  Continue zyrtec.  Reviewed supportive care and red flags that should prompt return.  Pt expressed understanding and is in agreement w/ plan.

## 2024-01-19 ENCOUNTER — Encounter: Payer: Self-pay | Admitting: Family Medicine

## 2024-01-19 ENCOUNTER — Ambulatory Visit: Payer: Managed Care, Other (non HMO) | Admitting: Family Medicine

## 2024-01-19 ENCOUNTER — Other Ambulatory Visit: Payer: Self-pay

## 2024-01-19 VITALS — BP 132/82 | HR 67 | Temp 97.9°F | Wt 258.2 lb

## 2024-01-19 DIAGNOSIS — E119 Type 2 diabetes mellitus without complications: Secondary | ICD-10-CM | POA: Diagnosis not present

## 2024-01-19 DIAGNOSIS — Z7984 Long term (current) use of oral hypoglycemic drugs: Secondary | ICD-10-CM

## 2024-01-19 DIAGNOSIS — Z125 Encounter for screening for malignant neoplasm of prostate: Secondary | ICD-10-CM

## 2024-01-19 DIAGNOSIS — H9192 Unspecified hearing loss, left ear: Secondary | ICD-10-CM

## 2024-01-19 DIAGNOSIS — Z1159 Encounter for screening for other viral diseases: Secondary | ICD-10-CM

## 2024-01-19 DIAGNOSIS — Z0000 Encounter for general adult medical examination without abnormal findings: Secondary | ICD-10-CM

## 2024-01-19 DIAGNOSIS — E1169 Type 2 diabetes mellitus with other specified complication: Secondary | ICD-10-CM

## 2024-01-19 LAB — CBC WITH DIFFERENTIAL/PLATELET
Basophils Absolute: 0.1 10*3/uL (ref 0.0–0.1)
Basophils Relative: 1.1 % (ref 0.0–3.0)
Eosinophils Absolute: 0.2 10*3/uL (ref 0.0–0.7)
Eosinophils Relative: 3.8 % (ref 0.0–5.0)
HCT: 43.9 % (ref 39.0–52.0)
Hemoglobin: 14.7 g/dL (ref 13.0–17.0)
Lymphocytes Relative: 36.2 % (ref 12.0–46.0)
Lymphs Abs: 1.9 10*3/uL (ref 0.7–4.0)
MCHC: 33.4 g/dL (ref 30.0–36.0)
MCV: 94.7 fl (ref 78.0–100.0)
Monocytes Absolute: 0.5 10*3/uL (ref 0.1–1.0)
Monocytes Relative: 10.2 % (ref 3.0–12.0)
Neutro Abs: 2.5 10*3/uL (ref 1.4–7.7)
Neutrophils Relative %: 48.7 % (ref 43.0–77.0)
Platelets: 255 10*3/uL (ref 150.0–400.0)
RBC: 4.64 Mil/uL (ref 4.22–5.81)
RDW: 13.2 % (ref 11.5–15.5)
WBC: 5.1 10*3/uL (ref 4.0–10.5)

## 2024-01-19 LAB — HEPATIC FUNCTION PANEL
ALT: 19 U/L (ref 0–53)
AST: 22 U/L (ref 0–37)
Albumin: 4.7 g/dL (ref 3.5–5.2)
Alkaline Phosphatase: 71 U/L (ref 39–117)
Bilirubin, Direct: 0.1 mg/dL (ref 0.0–0.3)
Total Bilirubin: 0.7 mg/dL (ref 0.2–1.2)
Total Protein: 7.1 g/dL (ref 6.0–8.3)

## 2024-01-19 LAB — HEMOGLOBIN A1C: Hgb A1c MFr Bld: 7.8 % — ABNORMAL HIGH (ref 4.6–6.5)

## 2024-01-19 LAB — TSH: TSH: 1.69 u[IU]/mL (ref 0.35–5.50)

## 2024-01-19 LAB — LIPID PANEL
Cholesterol: 230 mg/dL — ABNORMAL HIGH (ref 0–200)
HDL: 65.4 mg/dL (ref >39.00)
LDL Cholesterol: 136 mg/dL — ABNORMAL HIGH (ref 0–99)
NonHDL: 164.21
Total CHOL/HDL Ratio: 4
Triglycerides: 140 mg/dL (ref 0.0–149.0)
VLDL: 28 mg/dL (ref 0.0–40.0)

## 2024-01-19 LAB — BASIC METABOLIC PANEL WITH GFR
BUN: 13 mg/dL (ref 6–23)
CO2: 32 meq/L (ref 19–32)
Calcium: 9.3 mg/dL (ref 8.4–10.5)
Chloride: 99 meq/L (ref 96–112)
Creatinine, Ser: 1.03 mg/dL (ref 0.40–1.50)
GFR: 74.08 mL/min (ref >60.00)
Glucose, Bld: 199 mg/dL — ABNORMAL HIGH (ref 70–99)
Potassium: 4.9 meq/L (ref 3.5–5.1)
Sodium: 139 meq/L (ref 135–145)

## 2024-01-19 LAB — PSA: PSA: 2.28 ng/mL (ref 0.10–4.00)

## 2024-01-19 MED ORDER — AZELASTINE-FLUTICASONE 137-50 MCG/ACT NA SUSP: 1.0000 | Freq: Two times a day (BID) | NASAL | 1 refills | Status: AC

## 2024-01-19 MED ORDER — METFORMIN HCL 500 MG PO TABS: 500.0000 mg | ORAL_TABLET | Freq: Two times a day (BID) | ORAL | 1 refills | Status: DC

## 2024-01-19 MED ORDER — ATORVASTATIN CALCIUM 20 MG PO TABS: 20.0000 mg | ORAL_TABLET | Freq: Every day | ORAL | 1 refills | Status: DC

## 2024-01-19 NOTE — Progress Notes (Signed)
   Subjective:    Patient ID: Tony Garza, male    DOB: Sep 26, 1956, 68 y.o.   MRN: [id number]  HPI CPE- UTD on microalbumin, eye exam, foot exam, colonoscopy, Tdap, PNA  Patient Care Team    Relationship Specialty Notifications Start End  Sheliah Hatch, MD PCP - General Family Medicine  02/22/12    Comment: Lesleigh Noe, MD Consulting Physician Gastroenterology  03/27/20     Health Maintenance  Topic Date Due   Hepatitis C Screening  Never done   Zoster Vaccines- Shingrix (2 of 2) 10/07/2021   COVID-19 Vaccine (4 - 2024-25 season) 06/11/2023   HEMOGLOBIN A1C  01/16/2024   INFLUENZA VACCINE  05/10/2024   Diabetic kidney evaluation - eGFR measurement  07/17/2024   Diabetic kidney evaluation - Urine ACR  07/17/2024   OPHTHALMOLOGY EXAM  08/10/2024   FOOT EXAM  11/23/2024   Colonoscopy  09/16/2029   DTaP/Tdap/Td (3 - Td or Tdap) 11/23/2033   Pneumonia Vaccine 63+ Years old  Completed   HPV VACCINES  Aged Out   Meningococcal B Vaccine  Aged Out      Review of Systems Patient reports no vision changes, anorexia, fever ,adenopathy, persistant/recurrent hoarseness, swallowing issues, chest pain, palpitations, edema, persistant/recurrent cough, hemoptysis, dyspnea (rest,exertional, paroxysmal nocturnal), gastrointestinal  bleeding (melena, rectal bleeding), abdominal pain, excessive heart burn, GU symptoms (dysuria, hematuria, voiding/incontinence issues) syncope, focal weakness, memory loss, numbness & tingling, skin/hair/nail changes, depression, anxiety, abnormal bruising/bleeding, musculoskeletal symptoms/signs.   + decreased hearing L ear    Objective:   Physical Exam General Appearance:    Alert, cooperative, no distress, appears stated age, obese  Head:    Normocephalic, without obvious abnormality, atraumatic  Eyes:    PERRL, conjunctiva/corneas clear, EOM's intact both eyes       Ears:    Normal TM's and external ear canals, both ears  Nose:   Nares  normal, septum midline, mucosa normal, no drainage   or sinus tenderness  Throat:   Lips, mucosa, and tongue normal; teeth and gums normal  Neck:   Supple, symmetrical, trachea midline, no adenopathy;       thyroid:  No enlargement/tenderness/nodules  Back:     Symmetric, no curvature, ROM normal, no CVA tenderness  Lungs:     Clear to auscultation bilaterally, respirations unlabored  Chest wall:    No tenderness or deformity  Heart:    Regular rate and rhythm, S1 and S2 normal, no murmur, rub   or gallop  Abdomen:     Soft, non-tender, bowel sounds active all four quadrants,    no masses, no organomegaly  Genitalia:    deferred  Rectal:    Extremities:   Extremities normal, atraumatic, no cyanosis or edema  Pulses:   2+ and symmetric all extremities  Skin:   Skin color, texture, turgor normal, no rashes or lesions  Lymph nodes:   Cervical, supraclavicular, and axillary nodes normal  Neurologic:   CNII-XII intact. Normal strength, sensation and reflexes      throughout          Assessment & Plan:

## 2024-01-19 NOTE — Assessment & Plan Note (Signed)
 Pt's PE WNL w/ exception of BMI.  UTD on eye exam, foot exam, colonoscopy, Tdap, PNA.  Check labs.  Anticipatory guidance provided.

## 2024-01-19 NOTE — Addendum Note (Signed)
 Addended by: Eldred Manges on: 01/19/2024 02:39 PM   Modules accepted: Orders

## 2024-01-19 NOTE — Assessment & Plan Note (Signed)
Chronic problem.  UTD on foot exam, eye exam, microalbumin.  Check labs.  Adjust meds prn  

## 2024-01-19 NOTE — Telephone Encounter (Signed)
 Copied from CRM [id number]. Topic: Clinical - Prescription Issue >> Jan 19, 2024  2:54 PM Florestine Avers wrote: Reason for CRM: Patient called in stating that his medications was sent to the wrong pharmacy. Medication [facility]. Patient is asking for all medications that were sent earlier to [facility].

## 2024-01-19 NOTE — Patient Instructions (Addendum)
 Follow up in 6 months to recheck sugar, blood pressure, and cholesterol We'll notify you of your lab results and make any changes if needed Continue to work on healthy diet and regular exercise- you can do it! We'll call you to schedule your ENT appt for your L ear Call with any questions or concerns Stay Safe!  Stay Healthy! Happy Spring!!!

## 2024-01-20 LAB — HEPATITIS C ANTIBODY: Hepatitis C Ab: NONREACTIVE

## 2024-02-02 ENCOUNTER — Encounter: Payer: Self-pay | Admitting: Family Medicine

## 2024-02-26 ENCOUNTER — Other Ambulatory Visit: Payer: Self-pay | Admitting: Family Medicine

## 2024-02-26 DIAGNOSIS — K219 Gastro-esophageal reflux disease without esophagitis: Secondary | ICD-10-CM

## 2024-04-05 ENCOUNTER — Encounter: Payer: Self-pay | Admitting: Family Medicine

## 2024-04-05 DIAGNOSIS — E119 Type 2 diabetes mellitus without complications: Secondary | ICD-10-CM

## 2024-04-05 NOTE — Telephone Encounter (Signed)
 Called patient to discuss the liver function testing repeat is for taking Lipitor to make sure the body is metabolizing the medication and stated we do not usually do liver function testing for metformin  but I did state I could reach out to Dr.Tabori and ask if she would be okay repeating his A1C if he would like to check on these sugar levels if that was his concern with the metformin . I did ask patient if he was taking the lipitor and he stated yes he has been for years. I did read back the lab results from Dr.Tabori to patient to help explain what the liver function test was for but patient is still confused on exactly what Dr.Tabori was looking for.

## 2024-04-10 ENCOUNTER — Telehealth: Payer: Self-pay

## 2024-04-10 NOTE — Telephone Encounter (Signed)
 Copied from CRM [id number]. Topic: General - Other >> Apr 10, 2024  2:17 PM Sophia H wrote: Reason for CRM: Spoke with patient who is confused on why he is needing updated lab work. Per note in chart 04/28 from Dr. Mahlon :  I'm glad your brain is working b/c clearly mine had a pause!  I would like you to continue the Crestor  20mg  daily- this is more potent than the Atorvastatin  that was sent in.  When I was resulting your labs, the Crestor  that you are currently taking did not fit in the medication window and I didn't realize I had to scroll to see all of them.  So to me, it appeared that you were not currently on anything.  My mistake!!!  So please stick w/ the Crestor  (Rosuvastatin ) and continue to work on healthy diet and regular exercise.  Thanks for your patience!!! KT, MD  Patient states he was never on atorvastatin , he has only been taking the rosuvastatin  per Dr. Charis orders. Patient assumed the blood work Dr. Mahlon was wanting to have done was regarding the metformin . Patient states he doesn't mind coming in for blood work/an office visit he just wants to make sure everyone is on the same page as far as medications go. The only medication that was changed/added was the metformin . Please reach out to patient # [phone]

## 2024-04-10 NOTE — Telephone Encounter (Signed)
**Note De-identified  [place] Obfuscation** Please advise 

## 2024-04-10 NOTE — Telephone Encounter (Signed)
 Pt has been notified.

## 2024-04-10 NOTE — Telephone Encounter (Signed)
 Please apologize to Mr Tony Garza!  This is me trying to do too much in between patients!!  We do NOT need to repeat his cholesterol or A1C- we will do this at his upcoming visit in September but I do want to see what his kidney and liver functions are doing (since we started the Metformin ).  We have the appropriate labs ordered- I'm just not explaining myself

## 2024-06-06 ENCOUNTER — Encounter: Payer: Self-pay | Admitting: Family Medicine

## 2024-07-22 ENCOUNTER — Other Ambulatory Visit: Payer: Self-pay | Admitting: Family Medicine

## 2024-07-22 ENCOUNTER — Ambulatory Visit: Admitting: Family Medicine

## 2024-07-22 ENCOUNTER — Encounter: Payer: Self-pay | Admitting: Family Medicine

## 2024-07-22 VITALS — BP 134/72 | HR 64 | Temp 97.9°F | Ht 70.0 in | Wt 249.4 lb

## 2024-07-22 DIAGNOSIS — E119 Type 2 diabetes mellitus without complications: Secondary | ICD-10-CM

## 2024-07-22 DIAGNOSIS — I10 Essential (primary) hypertension: Secondary | ICD-10-CM

## 2024-07-22 DIAGNOSIS — E785 Hyperlipidemia, unspecified: Secondary | ICD-10-CM | POA: Diagnosis not present

## 2024-07-22 DIAGNOSIS — E1169 Type 2 diabetes mellitus with other specified complication: Secondary | ICD-10-CM

## 2024-07-22 LAB — BASIC METABOLIC PANEL WITH GFR
BUN: 11 mg/dL (ref 6–23)
CO2: 30 meq/L (ref 19–32)
Calcium: 9.2 mg/dL (ref 8.4–10.5)
Chloride: 101 meq/L (ref 96–112)
Creatinine, Ser: 1.05 mg/dL (ref 0.40–1.50)
GFR: 72.13 mL/min (ref >60.00)
Glucose, Bld: 186 mg/dL — ABNORMAL HIGH (ref 70–99)
Potassium: 4.6 meq/L (ref 3.5–5.1)
Sodium: 139 meq/L (ref 135–145)

## 2024-07-22 LAB — HEPATIC FUNCTION PANEL
ALT: 18 U/L (ref 0–53)
AST: 19 U/L (ref 0–37)
Albumin: 4.5 g/dL (ref 3.5–5.2)
Alkaline Phosphatase: 62 U/L (ref 39–117)
Bilirubin, Direct: 0.1 mg/dL (ref 0.0–0.3)
Total Bilirubin: 0.6 mg/dL (ref 0.2–1.2)
Total Protein: 7 g/dL (ref 6.0–8.3)

## 2024-07-22 LAB — CBC WITH DIFFERENTIAL/PLATELET
Basophils Absolute: 0.1 K/uL (ref 0.0–0.1)
Basophils Relative: 1.1 % (ref 0.0–3.0)
Eosinophils Absolute: 0.2 K/uL (ref 0.0–0.7)
Eosinophils Relative: 4.4 % (ref 0.0–5.0)
HCT: 45 % (ref 39.0–52.0)
Hemoglobin: 14.7 g/dL (ref 13.0–17.0)
Lymphocytes Relative: 31.1 % (ref 12.0–46.0)
Lymphs Abs: 1.6 K/uL (ref 0.7–4.0)
MCHC: 32.6 g/dL (ref 30.0–36.0)
MCV: 95.6 fl (ref 78.0–100.0)
Monocytes Absolute: 0.5 K/uL (ref 0.1–1.0)
Monocytes Relative: 9.9 % (ref 3.0–12.0)
Neutro Abs: 2.7 K/uL (ref 1.4–7.7)
Neutrophils Relative %: 53.5 % (ref 43.0–77.0)
Platelets: 263 K/uL (ref 150.0–400.0)
RBC: 4.7 Mil/uL (ref 4.22–5.81)
RDW: 13.8 % (ref 11.5–15.5)
WBC: 5.1 K/uL (ref 4.0–10.5)

## 2024-07-22 LAB — LIPID PANEL
Cholesterol: 187 mg/dL (ref 0–200)
HDL: 70.2 mg/dL (ref >39.00)
LDL Cholesterol: 92 mg/dL (ref 0–99)
NonHDL: 117.27
Total CHOL/HDL Ratio: 3
Triglycerides: 126 mg/dL (ref 0.0–149.0)
VLDL: 25.2 mg/dL (ref 0.0–40.0)

## 2024-07-22 LAB — HEMOGLOBIN A1C: Hgb A1c MFr Bld: 7.2 % — ABNORMAL HIGH (ref 4.6–6.5)

## 2024-07-22 LAB — TSH: TSH: 1.59 u[IU]/mL (ref 0.35–5.50)

## 2024-07-22 NOTE — Assessment & Plan Note (Signed)
 Chronic problem.  Well controlled on Lisinopril 10mg  daily.  Currently asymptomatic.  Check labs due to ACE use but no anticipated med changes.

## 2024-07-22 NOTE — Assessment & Plan Note (Signed)
 Chronic problem.  On Crestor 20mg  daily w/o difficulty.  Check labs.  Adjust meds prn

## 2024-07-22 NOTE — Patient Instructions (Signed)
 Schedule your complete physical in 6 months We'll notify you of your lab results and make any changes if needed Continue to work on healthy diet and regular exercise- you're doing great! HOLD Metformin  for 1-2 weeks and then message me regarding bowels Call with any questions or concerns Stay Safe!  Stay Healthy! Happy Fall!!

## 2024-07-22 NOTE — Assessment & Plan Note (Signed)
 Chronic problem.  Pt currently on Metformin  after last A1C was elevated at 7.8% but reports episodic diarrhea.  Will hold Metformin  x2 weeks and see if diarrhea resolves.  UTD on eye exam, foot exam.  Microalbumin ordered.  Check labs.  Adjust meds prn.

## 2024-07-22 NOTE — Progress Notes (Signed)
   Subjective:    Patient ID: Tony Garza, male    DOB: [birth year], 68 y.o.   MRN: [id number]  HPI HTN- chronic problem, on Lisinopril  10mg  daily w/ good control.  No CP, SOB, HA's, visual changes, edema.  Hyperlipidemia- chronic problem, on Crestor  20mg  daily.  Denies abd pain, N/V.  DM- chronic problem, on Metformin  500mg  BID.  UTD on foot exam, eye exam.  Due for microalbumin and A1C.  Down 9 lbs since last visit- exercising more regularly.  Pt reports episodes of diarrhea for the last few months.  Has tingling of 'whole L side'   Review of Systems For ROS see HPI     Objective:   Physical Exam        Assessment & Plan:

## 2024-07-23 ENCOUNTER — Ambulatory Visit: Payer: Self-pay | Admitting: Family Medicine

## 2024-07-23 LAB — MICROALBUMIN / CREATININE URINE RATIO
Creatinine,U: 161.5 mg/dL
Microalb Creat Ratio: 8.1 mg/g (ref 0.0–30.0)
Microalb, Ur: 1.3 mg/dL (ref 0.0–1.9)

## 2024-07-23 NOTE — Progress Notes (Signed)
 Lab results have been discussed.   Verbalized understanding? Yes  Are there any questions? No

## 2024-08-09 ENCOUNTER — Other Ambulatory Visit: Payer: Self-pay

## 2024-08-09 DIAGNOSIS — E119 Type 2 diabetes mellitus without complications: Secondary | ICD-10-CM

## 2024-08-09 MED ORDER — METFORMIN HCL 500 MG PO TABS: 500.0000 mg | ORAL_TABLET | Freq: Two times a day (BID) | ORAL | 1 refills | Status: AC

## 2024-09-02 ENCOUNTER — Other Ambulatory Visit: Payer: Self-pay | Admitting: Family Medicine

## 2024-09-02 DIAGNOSIS — I10 Essential (primary) hypertension: Secondary | ICD-10-CM

## 2024-09-02 MED ORDER — ROSUVASTATIN CALCIUM 20 MG PO TABS
20.0000 mg | ORAL_TABLET | Freq: Every day | ORAL | 3 refills | Status: AC
Start: 2024-09-02 — End: ?

## 2024-09-02 MED ORDER — LISINOPRIL 10 MG PO TABS: 10.0000 mg | ORAL_TABLET | Freq: Every day | ORAL | 3 refills | Status: AC

## 2024-09-02 NOTE — Telephone Encounter (Signed)
 Copied from CRM #[id number]. Topic: Clinical - Medication Refill >> Sep 02, 2024 12:31 PM Sasha M wrote: Medication: lisinopril  (ZESTRIL ) 10 MG tablet, rosuvastatin  (CRESTOR ) 20 MG tablet  Has the patient contacted their pharmacy? Yes (Agent: If no, request that the patient contact the pharmacy for the refill. If patient does not wish to contact the pharmacy document the reason why and proceed with request.) (Agent: If yes, when and what did the pharmacy advise?) Call provider  This is the patient's preferred pharmacy:   [facility] - [place], [place] - [address] [place] [address] Phone: [phone] Fax: [phone]  Is this the correct pharmacy for this prescription? Yes If no, delete pharmacy and type the correct one.   Has the prescription been filled recently? No  Is the patient out of the medication?Yes  Has the patient been seen for an appointment in the last year OR does the patient have an upcoming appointment? Yes  Can we respond through MyChart? Yes  Agent: Please be advised that Rx refills may take up to 3 business days. We ask that you follow-up with your pharmacy.

## 2024-09-09 ENCOUNTER — Ambulatory Visit (INDEPENDENT_AMBULATORY_CARE_PROVIDER_SITE_OTHER)

## 2024-09-09 DIAGNOSIS — Z23 Encounter for immunization: Secondary | ICD-10-CM | POA: Diagnosis not present

## 2024-09-09 NOTE — Progress Notes (Signed)
 Tony Garza is a 68 y.o. male presents in office today for a nurse visit for Immunization.   Patient Injection was given in the  Right arm. Patient tolerated injection well.   Patient's next injection due N/A, appt made? not applicable  Alfredo DELENA Shope

## 2024-09-17 LAB — OPHTHALMOLOGY REPORT-SCANNED

## 2024-10-13 ENCOUNTER — Ambulatory Visit
Admission: EM | Admit: 2024-10-13 | Discharge: 2024-10-13 | Disposition: A | Discharge status: Home / Self Care | Attending: Family Medicine | Admitting: Family Medicine

## 2024-10-13 DIAGNOSIS — B9789 Other viral agents as the cause of diseases classified elsewhere: Secondary | ICD-10-CM

## 2024-10-13 DIAGNOSIS — J988 Other specified respiratory disorders: Secondary | ICD-10-CM | POA: Diagnosis not present

## 2024-10-13 DIAGNOSIS — H6693 Otitis media, unspecified, bilateral: Secondary | ICD-10-CM

## 2024-10-13 MED ORDER — AZITHROMYCIN 250 MG PO TABS: ORAL_TABLET | ORAL | 0 refills | Status: AC

## 2024-10-13 NOTE — Discharge Instructions (Signed)
 Start azithromycin  to help with a double ear infection secondary to your viral respiratory infection. For sore throat or cough try using a honey-based tea. Use 3 teaspoons of honey with juice squeezed from half lemon. Place shaved pieces of ginger into 1/2-1 cup of water and warm over stove top. Then mix the ingredients and repeat every 4 hours as needed. Please take Tylenol 500mg -650mg  once every 6 hours for fevers, aches and pains. Hydrate very well with at least 2 liters (64 ounces) of water. Eat light meals such as soups (chicken and noodles, chicken wild rice, vegetable).  Do not eat any foods that you are allergic to.  Start an antihistamine like Zyrtec  (10mg  daily) for postnasal drainage, sinus congestion.  You can take this together with pseudoephedrine  (Sudafed) at a dose of 30 mg 3 times a day or twice daily as needed for the same kind of congestion.

## 2024-10-13 NOTE — ED Triage Notes (Signed)
 Pt c/o cough, head/chest congestion, sore throat, bilat earache-sx started 12/30-last alka seltzer cold plus 5am-NAD-steady gait

## 2024-10-13 NOTE — ED Provider Notes (Signed)
 " [facility]  Note:  This document was prepared using Conservation officer, historic buildings and may include unintentional dictation errors.  MRN: [id number] DOB: 1956-06-27  Subjective:   Tony Garza is a 69 y.o. male presenting for 5-day history of acute onset persistent and worsening sinus congestion, drainage, throat pain, coughing now having worsening bilateral ear pain especially the left with fullness, wheezing.  Has a history of ear infections and sinus infections.  Has not done well with amoxicillin .  Has done better with azithromycin .  Has type 2 diabetes, last A1c was 7.2% from 07/2024.  No fever, body pains, chest pain, shortness of breath.  No asthma.  No smoking of any kind including cigarettes, cigars, vaping, marijuana use.    Current Outpatient Medications  Medication Instructions   ascorbic acid (VITAMIN C) 500 mg, Daily   aspirin 81 mg, Daily   Azelastine -Fluticasone  137-50 MCG/ACT SUSP 1 spray, Nasal, Every 12 hours   cetirizine  (ZYRTEC ) 10 mg, Oral, Daily   cyanocobalamin (VITAMIN B12) 1,000 mcg, Daily   glucosamine-chondroitin 500-400 MG tablet 1 tablet, Daily   KRILL OIL PO 500 mg   lisinopril  (ZESTRIL ) 10 mg, Oral, Daily   metFORMIN  (GLUCOPHAGE ) 500 mg, Oral, 2 times daily with meals   Multiple Vitamin (MULTIVITAMIN) tablet 1 tablet, Daily   naproxen  (NAPROSYN ) 500 mg   OVER THE COUNTER MEDICATION 500 mg, Daily   pantoprazole  (PROTONIX ) 40 mg, Oral, Daily   rosuvastatin  (CRESTOR ) 20 mg, Oral, Daily   triamcinolone  cream (KENALOG ) 0.1 % 1 application , Topical, 2 times daily    Allergies[1]  Past Medical History:  Diagnosis Date   Anxiety    Arthritis    osteoarthritis   Cancer (HCC)    Skin cancer   Chicken pox    Colon polyp    Diabetes mellitus, type 2 (HCC) 06/30/2011   controlled with diet/no meds. per pt.   ED (erectile dysfunction)    GERD (gastroesophageal reflux disease)    Hyperlipidemia    Obesity    Peyronie  disease    Unspecified essential hypertension 06/30/2011     Past Surgical History:  Procedure Laterality Date   COLONOSCOPY  10 years ago?   done in [facility], pt will try to find records.   POLYPECTOMY  10 years ago ?   SKIN TAG REMOVAL     various removal from skin   TONSILLECTOMY  1960   UMBILICAL HERNIA REPAIR  08/2016   VASECTOMY      Family History  Problem Relation Age of Onset   Diabetes Mother    Cancer Mother        Lung?   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: [employer]  Tobacco Use   Smoking status: Former    Current packs/day: 0.00    Average packs/day: 1 pack/day for 20.0 years (20.0 ttl pk-yrs)    Types: Cigarettes    Start date: 25    Quit date: 2008    Years since quitting: 18.0    Passive exposure: Never  Smokeless tobacco: Never  Vaping Use   Vaping status: Never Used  Substance and Sexual Activity   Alcohol use: Yes    Alcohol/week: 15.0 standard drinks of alcohol    Types: 15 Cans of beer per week    Comment: 15 beers per week per pt   Drug use: No   Sexual activity: Not Currently     ROS   Objective:   Vitals: BP 123/81 (BP Location: Left Arm)   Pulse 87   Temp 98.9 F (37.2 C) (Oral)   Resp 20   SpO2 98%   Physical Exam Constitutional:      General: He is not in acute distress.    Appearance: Normal appearance. He is well-developed and normal weight. He is not ill-appearing, toxic-appearing or diaphoretic.  HENT:     Head: Normocephalic and atraumatic.     Right Ear: Ear canal and external ear normal. No drainage, swelling or tenderness. A middle ear effusion is present. There is no impacted cerumen. Tympanic membrane is erythematous.  Tympanic membrane is not bulging.     Left Ear: Ear canal and external ear normal. No drainage, swelling or tenderness. A middle ear effusion is present. There is no impacted cerumen. Tympanic membrane is erythematous and bulging.     Nose: Nose normal. No congestion or rhinorrhea.     Mouth/Throat:     Mouth: Mucous membranes are moist.     Pharynx: Posterior oropharyngeal erythema present. No oropharyngeal exudate.  Eyes:     General: No scleral icterus.       Right eye: No discharge.        Left eye: No discharge.     Extraocular Movements: Extraocular movements intact.     Conjunctiva/sclera: Conjunctivae normal.  Cardiovascular:     Rate and Rhythm: Normal rate and regular rhythm.     Heart sounds: Normal heart sounds. No murmur heard.    No friction rub. No gallop.  Pulmonary:     Effort: Pulmonary effort is normal. No respiratory distress.     Breath sounds: Normal breath sounds. No stridor. No wheezing, rhonchi or rales.  Musculoskeletal:     Cervical back: Normal range of motion and neck supple. No rigidity. No muscular tenderness.  Neurological:     General: No focal deficit present.     Mental Status: He is alert and oriented to person, place, and time.  Psychiatric:        Mood and Affect: Mood normal.        Behavior: Behavior normal.        Thought Content: Thought content normal.     Assessment and Plan :   PDMP not reviewed this encounter.  1. Acute otitis media, bilateral   2. Viral respiratory infection      Start azithromycin  to cover for otitis media likely secondary to a viral respiratory infection. Use supportive care otherwise. Counseled patient on potential for adverse effects with medications prescribed/recommended today, ER and return-to-clinic precautions discussed, patient verbalized understanding.     [1] No Known Allergies    Ladarren Savannah, [place] 10/13/24 [id number]  "

## 2024-10-17 ENCOUNTER — Encounter: Payer: Self-pay | Admitting: Family Medicine

## 2024-10-21 ENCOUNTER — Ambulatory Visit (INDEPENDENT_AMBULATORY_CARE_PROVIDER_SITE_OTHER): Admitting: Family Medicine

## 2024-10-21 VITALS — BP 145/90 | HR 72 | Ht 70.0 in | Wt 249.0 lb

## 2024-10-21 DIAGNOSIS — H6503 Acute serous otitis media, bilateral: Secondary | ICD-10-CM | POA: Diagnosis not present

## 2024-10-21 MED ORDER — PREDNISONE 10 MG PO TABS: ORAL_TABLET | ORAL | 0 refills | Status: AC

## 2024-10-21 NOTE — Patient Instructions (Signed)
 Follow up as needed or as scheduled START the Prednisone  as directed- 3 pills at the same time x3 days, then 2 pills at the same time x3 days, then 1 pill daily.  Take w/ food  Make sure you are taking a daily allergy pill Continue to use your nasal spray at least until your ear is feeling better Drink LOTS of fluids Call with any questions or concerns Hang in there!!!

## 2024-10-21 NOTE — Progress Notes (Signed)
" ° °  Subjective:    Patient ID: Tony Garza, male    DOB: 26-Oct-1955, 69 y.o.   MRN: [id number]  HPI Ear issues- pt was dx'd w/ bilateral OM on 1/4 and tx'd w/ Zpack.  Original sxs started 12/30 w/ sore throat and nasal congestion.  Now having hearing loss in L ear.  Also having balance issues.  No drainage from ear but did have earlier in illness.  No fever.  Occasional HA.     Review of Systems For ROS see HPI     Objective:   Physical Exam Vitals reviewed.  Constitutional:      General: He is not in acute distress.    Appearance: Normal appearance. He is not ill-appearing.  HENT:     Head: Normocephalic and atraumatic.     Right Ear: A middle ear effusion is present.     Left Ear: No drainage. A middle ear effusion is present. Tympanic membrane is erythematous. Tympanic membrane is not bulging. Tympanic membrane has decreased mobility.     Nose: Congestion present. No rhinorrhea.     Comments: No TTP over frontal or maxillary sinuses    Mouth/Throat:     Mouth: Mucous membranes are moist.     Pharynx: Oropharynx is clear. No oropharyngeal exudate or posterior oropharyngeal erythema.  Musculoskeletal:     Cervical back: Neck supple.  Lymphadenopathy:     Cervical: No cervical adenopathy.  Neurological:     Mental Status: He is alert.           Assessment & Plan:  Serous otitis- new.  Bilateral.  Zpack seems to have cleared infxn and pt is no longer having pain but continues to have decreased hearing and balance issues.  Suspect middle ear effusions/congestion/inflammation are the cause.  Start Prednisone  taper.  Reviewed supportive care and red flags that should prompt return.  Pt expressed understanding and is in agreement w/ plan.   "

## 2025-01-23 ENCOUNTER — Encounter: Admitting: Family Medicine
